# Patient Record
Sex: Female | Born: 1965
Health system: Southern US, Community
[De-identification: ages and names within clinical notes are randomized; demographics above are authoritative.]

## PROBLEM LIST (undated history)

## (undated) DIAGNOSIS — K279 Peptic ulcer, site unspecified, unspecified as acute or chronic, without hemorrhage or perforation: Secondary | ICD-10-CM

## (undated) DIAGNOSIS — K219 Gastro-esophageal reflux disease without esophagitis: Secondary | ICD-10-CM

## (undated) DIAGNOSIS — N924 Excessive bleeding in the premenopausal period: Secondary | ICD-10-CM

## (undated) DIAGNOSIS — A599 Trichomoniasis, unspecified: Secondary | ICD-10-CM

## (undated) DIAGNOSIS — E785 Hyperlipidemia, unspecified: Secondary | ICD-10-CM

## (undated) DIAGNOSIS — I10 Essential (primary) hypertension: Secondary | ICD-10-CM

## (undated) DIAGNOSIS — J45909 Unspecified asthma, uncomplicated: Secondary | ICD-10-CM

## (undated) DIAGNOSIS — M1990 Unspecified osteoarthritis, unspecified site: Secondary | ICD-10-CM

## (undated) DIAGNOSIS — E119 Type 2 diabetes mellitus without complications: Secondary | ICD-10-CM

## (undated) HISTORY — DX: Unspecified osteoarthritis, unspecified site: M19.90

## (undated) HISTORY — DX: Unspecified asthma, uncomplicated: J45.909

## (undated) HISTORY — DX: Hyperlipidemia, unspecified: E78.5

## (undated) HISTORY — DX: Essential (primary) hypertension: I10

## (undated) HISTORY — DX: Type 2 diabetes mellitus without complications: E11.9

## (undated) HISTORY — PX: BACK SURGERY: SHX140

## (undated) HISTORY — DX: Peptic ulcer, site unspecified, unspecified as acute or chronic, without hemorrhage or perforation: K27.9

## (undated) HISTORY — DX: Gastro-esophageal reflux disease without esophagitis: K21.9

## (undated) HISTORY — PX: CHOLECYSTECTOMY: SHX55

## (undated) HISTORY — DX: Trichomoniasis, unspecified: A59.9

---

## 2005-08-13 ENCOUNTER — Encounter: Admission: RE | Admit: 2005-08-13 | Discharge: 2005-08-23 | Payer: Self-pay | Admitting: Neurosurgery

## 2005-10-22 ENCOUNTER — Encounter: Admission: RE | Admit: 2005-10-22 | Discharge: 2005-10-22 | Payer: Self-pay | Admitting: Neurosurgery

## 2005-11-05 ENCOUNTER — Encounter: Admission: RE | Admit: 2005-11-05 | Discharge: 2005-11-05 | Payer: Self-pay | Admitting: Neurosurgery

## 2009-01-12 ENCOUNTER — Ambulatory Visit (HOSPITAL_COMMUNITY): Admission: RE | Admit: 2009-01-12 | Discharge: 2009-01-13 | Payer: Self-pay | Admitting: Neurosurgery

## 2010-11-02 ENCOUNTER — Emergency Department (HOSPITAL_COMMUNITY)
Admission: EM | Admit: 2010-11-02 | Discharge: 2010-11-02 | Disposition: A | Discharge status: Home / Self Care | Payer: Self-pay | Attending: Emergency Medicine | Admitting: Emergency Medicine

## 2010-11-02 DIAGNOSIS — E119 Type 2 diabetes mellitus without complications: Secondary | ICD-10-CM | POA: Insufficient documentation

## 2010-11-02 DIAGNOSIS — N390 Urinary tract infection, site not specified: Secondary | ICD-10-CM | POA: Insufficient documentation

## 2010-11-02 LAB — DIFFERENTIAL
Basophils Absolute: 0.1 10*3/uL (ref 0.0–0.1)
Basophils Relative: 0 % (ref 0–1)
Monocytes Absolute: 0.8 10*3/uL (ref 0.1–1.0)
Monocytes Relative: 6 % (ref 3–12)
Neutrophils Relative %: 62 % (ref 43–77)

## 2010-11-02 LAB — URINE MICROSCOPIC-ADD ON

## 2010-11-02 LAB — URINALYSIS, ROUTINE W REFLEX MICROSCOPIC
Hgb urine dipstick: NEGATIVE
Ketones, ur: NEGATIVE mg/dL
Leukocytes, UA: NEGATIVE
Nitrite: POSITIVE — AB
Protein, ur: NEGATIVE mg/dL
Urine Glucose, Fasting: >1000 mg/dL — AB
Urobilinogen, UA: 0.2 mg/dL (ref 0.0–1.0)

## 2010-11-02 LAB — CBC
HCT: 39.8 % (ref 36.0–46.0)
MCH: 28.9 pg (ref 26.0–34.0)
MCHC: 33.7 g/dL (ref 30.0–36.0)
MCV: 86 fL (ref 78.0–100.0)
WBC: 13.8 10*3/uL — ABNORMAL HIGH (ref 4.0–10.5)

## 2010-11-02 LAB — BASIC METABOLIC PANEL
CO2: 27 mEq/L (ref 19–32)
Chloride: 93 mEq/L — ABNORMAL LOW (ref 96–112)

## 2010-11-04 LAB — URINE CULTURE
Colony Count: >=100000
Culture  Setup Time: 201203020131

## 2010-11-09 ENCOUNTER — Emergency Department (HOSPITAL_COMMUNITY)
Admission: EM | Admit: 2010-11-09 | Discharge: 2010-11-10 | Disposition: A | Discharge status: Home / Self Care | Payer: Self-pay | Attending: Emergency Medicine | Admitting: Emergency Medicine

## 2010-11-09 DIAGNOSIS — E119 Type 2 diabetes mellitus without complications: Secondary | ICD-10-CM | POA: Insufficient documentation

## 2010-11-09 DIAGNOSIS — M1990 Unspecified osteoarthritis, unspecified site: Secondary | ICD-10-CM | POA: Insufficient documentation

## 2010-11-09 DIAGNOSIS — I10 Essential (primary) hypertension: Secondary | ICD-10-CM | POA: Insufficient documentation

## 2010-11-09 LAB — BASIC METABOLIC PANEL
GFR calc Af Amer: >60 mL/min (ref >60)
GFR calc non Af Amer: >60 mL/min (ref >60)
Potassium: 3.7 mEq/L (ref 3.5–5.1)

## 2010-11-09 LAB — URINE MICROSCOPIC-ADD ON

## 2010-11-09 LAB — URINALYSIS, ROUTINE W REFLEX MICROSCOPIC
Hgb urine dipstick: NEGATIVE
Ketones, ur: >80 mg/dL — AB
Nitrite: NEGATIVE
Urobilinogen, UA: 0.2 mg/dL (ref 0.0–1.0)

## 2010-11-10 LAB — TROPONIN I: Troponin I: 0.01 ng/mL (ref 0.00–0.06)

## 2010-12-12 LAB — CBC
HCT: 39.6 % (ref 36.0–46.0)
MCHC: 33.5 g/dL (ref 30.0–36.0)
Platelets: 336 10*3/uL (ref 150–400)
RBC: 4.32 MIL/uL (ref 3.87–5.11)
WBC: 12.5 10*3/uL — ABNORMAL HIGH (ref 4.0–10.5)

## 2010-12-12 LAB — BASIC METABOLIC PANEL
Calcium: 9.4 mg/dL (ref 8.4–10.5)
Creatinine, Ser: 0.78 mg/dL (ref 0.4–1.2)

## 2011-01-16 NOTE — H&P (Signed)
NAMEMARRI, MCNEFF                ACCOUNT NO.:  [id number]   MEDICAL RECORD NO.:  [id number]          PATIENT TYPE:  OIB   LOCATION:  3533                         FACILITY:  [facility]   PHYSICIAN:  Hilda Lias, M.D.   DATE OF BIRTH:  03-26-1966   DATE OF ADMISSION:  01/12/2009  DATE OF DISCHARGE:                              HISTORY & PHYSICAL   [name] is a lady who had been followed by me in my office for many  years complaining of back pain with radiation to the left leg.  The  patient had conservative treatment.  The pain is getting worse.  Initial, when I saw her in 2006, she had a herniated disk at the level  of L3-4 to the left side.  Nevertheless, through the year, the pain is  getting worse.  She __________ surgery.  She had been out of work.  Injection has not helped.  Repeat x-rays showed that she has a large  extraforaminal disk at the level of L3-4 and finally she decided with  surgery.   PAST MEDICAL HISTORY:  She had some type of surgery in her mouth.   She is not allergic to any medications.   SOCIAL HISTORY:  Negative.   FAMILY HISTORY:  Negative.   REVIEW OF SYSTEMS:  Positive for back pain and leg pain.   PHYSICAL EXAMINATION:  GENERAL:  The patient came to my office limping  from the left leg.  EARS, NOSE, AND THROAT:  Normal.  NECK:  Normal.  LUNGS:  Clear.  HEART:  Sounds normal.  EXTREMITIES:  Normal pulses.  NEURO:  She has weakness of the left quadriceps.  Femoral stretch  maneuver is positive in the left side and negative in the right side.  She has absent left knee jerk.   The MRI showed that she has had an extraforaminal herniated disk, large,  affecting the L3 nerve root.   IMPRESSION:  Left L3-L4 extraforaminal herniated disk.   RECOMMENDATIONS:  The patient is being admitted for surgery and the  procedure will be L3-L4 extraforaminal diskectomy.  The patient knows  about the risks such as infection, CSF leak, no improvement whatsoever  because of the chronicity pain.           ______________________________  Hilda Lias, M.D.    EB/MEDQ  D:  01/12/2009  T:  01/13/2009  Job:  [id number]

## 2011-01-16 NOTE — Op Note (Signed)
Laura Mullins, Laura Mullins                ACCOUNT NO.:  [id number]   MEDICAL RECORD NO.:  [id number]          PATIENT TYPE:  OIB   LOCATION:  3533                         FACILITY:  [facility]   PHYSICIAN:  Hilda Lias, M.D.   DATE OF BIRTH:  04-Sep-1965   DATE OF PROCEDURE:  01/12/2009  DATE OF DISCHARGE:                               OPERATIVE REPORT   PREOPERATIVE DIAGNOSES:  Left L3-L4 extraforaminal herniated disk with  chronic L3 radiculopathy.   POSTOPERATIVE DIAGNOSES:  Left L3-L4 extraforaminal herniated disk with  chronic L3 radiculopathy.   PROCEDURE:  Left L3-L4 extraforaminal diskectomy, decompression of the  L3 nerve root.  Removal of calcified herniated disk as well as soft  component.  Microscope.   SURGEON:  Hilda Lias, MD   ASSISTANT:  Cristi Loron, MD   CLINICAL HISTORY:  The patient has been followed by me for almost 4  years complaining of back and left leg pain.  Clinically, she is getting  worse.  She had failed conservative treatment.  MRI showed herniated  disk at L3-4.  The patient finally agreed with surgery.   PROCEDURE:  The patient was taken to the OR and after intubation, she  was positioned in prone manner.  The skin was cleaned with DuraPrep.  X-  ray was obtained and a midline incision from L3-4 was made and  retraction was extending all the way laterally until we were able to  feel the transverse process of L3-4.  Nevertheless, we did another x-  ray, then one probe was at the level of L3-4 and the other one was above  the transverse process of L3.  Then, we brought the microscope into the  area.  With the drill, we drilled the lateral facet of L3-4.  We removed  the intertransverse ligament.  We found the L3 nerve root which was  attached to the floor.  It was swollen and displaced posterior and  medially.  The patient had a large herniated disk with a hard and soft  component.  Lysis was accomplished.  We retracted the nerve root and  removal of soft herniated disk was accomplished.  We entered the disk  space and more soft tissue was retrieved.  At the end, we had good  decompression.  Valsalva maneuver was negative.  Then, Depo-Medrol and  fentanyl were left in the epidural space and the wound was closed with  Vicryl and Steri-Strips.           ______________________________  Hilda Lias, M.D.     EB/MEDQ  D:  01/12/2009  T:  01/13/2009  Job:  [id number]

## 2013-12-31 ENCOUNTER — Other Ambulatory Visit (HOSPITAL_COMMUNITY): Payer: Self-pay | Admitting: Physician Assistant

## 2013-12-31 DIAGNOSIS — Z1231 Encounter for screening mammogram for malignant neoplasm of breast: Secondary | ICD-10-CM

## 2014-01-07 ENCOUNTER — Ambulatory Visit (HOSPITAL_COMMUNITY)
Admission: RE | Admit: 2014-01-07 | Discharge: 2014-01-07 | Disposition: A | Discharge status: Home / Self Care | Payer: Medicare Other | Source: Ambulatory Visit | Attending: Physician Assistant | Admitting: Physician Assistant

## 2014-01-07 DIAGNOSIS — Z1231 Encounter for screening mammogram for malignant neoplasm of breast: Secondary | ICD-10-CM

## 2014-04-06 ENCOUNTER — Other Ambulatory Visit (HOSPITAL_COMMUNITY): Payer: Self-pay | Admitting: Physician Assistant

## 2014-04-06 ENCOUNTER — Encounter: Payer: Self-pay | Admitting: Obstetrics & Gynecology

## 2014-04-06 DIAGNOSIS — N939 Abnormal uterine and vaginal bleeding, unspecified: Secondary | ICD-10-CM

## 2014-04-09 ENCOUNTER — Ambulatory Visit (HOSPITAL_COMMUNITY)
Admission: RE | Admit: 2014-04-09 | Discharge: 2014-04-09 | Disposition: A | Discharge status: Home / Self Care | Payer: Medicare Other | Source: Ambulatory Visit | Attending: Physician Assistant | Admitting: Physician Assistant

## 2014-04-09 ENCOUNTER — Ambulatory Visit (HOSPITAL_COMMUNITY): Payer: Medicare Other

## 2014-04-09 DIAGNOSIS — N83209 Unspecified ovarian cyst, unspecified side: Secondary | ICD-10-CM | POA: Insufficient documentation

## 2014-04-09 DIAGNOSIS — N939 Abnormal uterine and vaginal bleeding, unspecified: Secondary | ICD-10-CM

## 2014-04-09 DIAGNOSIS — N898 Other specified noninflammatory disorders of vagina: Secondary | ICD-10-CM | POA: Diagnosis present

## 2014-04-20 ENCOUNTER — Encounter: Payer: Self-pay | Admitting: *Deleted

## 2014-04-20 DIAGNOSIS — N83201 Unspecified ovarian cyst, right side: Secondary | ICD-10-CM | POA: Insufficient documentation

## 2014-04-20 DIAGNOSIS — N924 Excessive bleeding in the premenopausal period: Secondary | ICD-10-CM | POA: Insufficient documentation

## 2014-04-20 DIAGNOSIS — R9389 Abnormal findings on diagnostic imaging of other specified body structures: Secondary | ICD-10-CM | POA: Insufficient documentation

## 2014-04-23 ENCOUNTER — Encounter: Payer: Self-pay | Admitting: Obstetrics and Gynecology

## 2014-04-23 ENCOUNTER — Ambulatory Visit (INDEPENDENT_AMBULATORY_CARE_PROVIDER_SITE_OTHER): Payer: Medicare Other | Admitting: Obstetrics and Gynecology

## 2014-04-23 VITALS — BP 120/80 | Ht 67.0 in | Wt 193.0 lb

## 2014-04-23 DIAGNOSIS — N898 Other specified noninflammatory disorders of vagina: Secondary | ICD-10-CM

## 2014-04-23 DIAGNOSIS — N939 Abnormal uterine and vaginal bleeding, unspecified: Secondary | ICD-10-CM

## 2014-04-23 MED ORDER — MEDROXYPROGESTERONE ACETATE 10 MG PO TABS: 10.0000 mg | ORAL_TABLET | Freq: Every day | ORAL | Status: DC

## 2014-04-23 NOTE — Progress Notes (Signed)
[facility] Visit  Patient name: Laura RivalStella K Alto MRN [id number]  Date of birth: [birth year]  CC & HPI:  Laura Mullins is a 48 y.o. female presenting today for irregular periods for the past six months. She states that her periods have gotten progressively worse. She states that she had no period in April, a regular period in May, 2 periods in June and has been bleeding since then. She states that her periods have been heavy for awhile now. [facility]. She states that her sugar has been good lately.   ROS:  +Irregular periods No other complaints.  Pertinent History Reviewed:   Reviewed: Significant for  Medical         Past Medical History  Diagnosis Date  . Asthma   . Diabetes mellitus without complication   . Hypertension   . Hyperlipidemia   . Arthritis                               Surgical Hx:    Past Surgical History  Procedure Laterality Date  . Back surgery     Medications: Reviewed & Updated - see associated section                      Current outpatient prescriptions:albuterol (PROVENTIL HFA;VENTOLIN HFA) 108 (90 BASE) MCG/ACT inhaler, Inhale 2 puffs into the lungs every 6 (six) hours as needed for wheezing or shortness of breath., Disp: , Rfl: ;  albuterol (PROVENTIL) (2.5 MG/3ML) 0.083% nebulizer solution, Take 2.5 mg by nebulization every 6 (six) hours as needed for wheezing or shortness of breath., Disp: , Rfl:  aspirin EC 81 MG tablet, Take 81 mg by mouth daily., Disp: , Rfl: ;  gabapentin (NEURONTIN) 600 MG tablet, Take 600 mg by mouth as needed., Disp: , Rfl: ;  glipiZIDE (GLUCOTROL) 10 MG tablet, Take 10 mg by mouth 2 (two) times daily before a meal., Disp: , Rfl: ;  lisinopril-hydrochlorothiazide (PRINZIDE,ZESTORETIC) 20-25 MG per tablet, Take 1 tablet by mouth daily., Disp: , Rfl:  loratadine (CLARITIN) 10 MG tablet, Take 10 mg by mouth daily as needed for allergies., Disp: , Rfl: ;  pantoprazole (PROTONIX) 40 MG tablet,  Take 40 mg by mouth daily., Disp: , Rfl: ;  rosuvastatin (CRESTOR) 20 MG tablet, Take 20 mg by mouth daily., Disp: , Rfl: ;  sitaGLIPtin-metformin (JANUMET) 50-1000 MG per tablet, Take 1 tablet by mouth 2 (two) times daily with a meal., Disp: , Rfl:    Social History: Reviewed -  reports that she has never smoked. She has never used smokeless tobacco.  Objective Findings:  Vitals: Blood pressure 120/80, height 5\' 7"  (1.702 m), weight 193 lb (87.544 kg), last menstrual period 02/28/2014.  Physical Examination: General appearance - alert, well appearing, and in no distress and oriented to person, place, and time Pelvic - normal external genitalia, vulva, vagina, cervix, uterus and adnexa, VULVA: normal appearing vulva with no masses, tenderness or lesions,  VAGINA: normal appearing vagina with normal color and discharge, no lesions, menstural blood present.  CERVIX: normal appearing cervix without discharge or lesions,  UTERUS: non-tender, 8 week size,  ADNEXA: normal adnexa in size, nontender and no masses,  RECTAL: rectal exam not indicated   Assessment & Plan:   A:  1. AUB  P:  1. Provera 10 mg daily x 14 days for 3 months.  2. Provera to take every month to bring on a period.   This chart was scribed by Chestine Spore, Medical Scribe, for Dr. Christin Bach on 04/23/14 at 1:08 PM. This chart was reviewed by Dr. Christin Bach for accuracy.

## 2014-06-04 ENCOUNTER — Telehealth: Payer: Self-pay | Admitting: Family Medicine

## 2014-06-04 ENCOUNTER — Encounter: Payer: Self-pay | Admitting: Family Medicine

## 2014-06-04 ENCOUNTER — Ambulatory Visit (INDEPENDENT_AMBULATORY_CARE_PROVIDER_SITE_OTHER): Payer: Medicare Other | Admitting: Family Medicine

## 2014-06-04 VITALS — BP 133/86 | HR 80 | Temp 97.9°F | Ht 67.0 in | Wt 197.0 lb

## 2014-06-04 DIAGNOSIS — R39198 Other difficulties with micturition: Secondary | ICD-10-CM

## 2014-06-04 DIAGNOSIS — I10 Essential (primary) hypertension: Secondary | ICD-10-CM

## 2014-06-04 DIAGNOSIS — N390 Urinary tract infection, site not specified: Secondary | ICD-10-CM

## 2014-06-04 DIAGNOSIS — R3989 Other symptoms and signs involving the genitourinary system: Secondary | ICD-10-CM

## 2014-06-04 LAB — POCT UA - MICROSCOPIC ONLY
Casts, Ur, LPF, POC: NEGATIVE
Crystals, Ur, HPF, POC: NEGATIVE
Mucus, UA: NEGATIVE

## 2014-06-04 LAB — POCT URINALYSIS DIPSTICK
Glucose, UA: 250
Ketones, UA: NEGATIVE
Nitrite, UA: NEGATIVE
Protein, UA: NEGATIVE
Spec Grav, UA: <=1.005
Urobilinogen, UA: NEGATIVE
pH, UA: 5

## 2014-06-04 MED ORDER — CIPROFLOXACIN HCL 500 MG PO TABS: 500.0000 mg | ORAL_TABLET | Freq: Two times a day (BID) | ORAL | Status: DC

## 2014-06-04 MED ORDER — LISINOPRIL-HYDROCHLOROTHIAZIDE 20-12.5 MG PO TABS: 1.0000 | ORAL_TABLET | Freq: Every day | ORAL | Status: AC

## 2014-06-04 NOTE — Telephone Encounter (Signed)
appt given for 3 today for UTI and [facility]

## 2014-06-04 NOTE — Progress Notes (Signed)
   Subjective:    Patient ID: Laura Mullins, female    DOB: 10/24/1965, 48 y.o.   MRN: [id number]  HPI This 48 y.o. female presents for evaluation of urinary frequency.   Review of Systems No chest pain, SOB, HA, dizziness, vision change, N/V, diarrhea, constipation, dysuria, urinary urgency or frequency, myalgias, arthralgias or rash.     Objective:   Physical Exam  Vital signs noted  Well developed well nourished female.  HEENT - Head atraumatic Normocephalic Respiratory - Lungs CTA bilateral Cardiac - RRR S1 and S2 without murmur GI - Abdomen soft Nontender and bowel sounds active x 4 Extremities - No edema. Neuro - Grossly intact.      Assessment & Plan:  Difficulty in urination - Plan: POCT UA - Microscopic Only, POCT urinalysis dipstick  Urinary tract infection without hematuria, site unspecified - Plan: Urine culture, ciprofloxacin (CIPRO) 500 MG tablet  Essential hypertension, benign - Plan: lisinopril-hydrochlorothiazide (ZESTORETIC) 20-12.5 MG per tablet  Deatra CanterWilliam J Oxford FNP

## 2014-06-05 LAB — URINE CULTURE

## 2014-08-23 ENCOUNTER — Ambulatory Visit: Payer: Medicare Other | Admitting: Obstetrics and Gynecology

## 2014-08-23 ENCOUNTER — Encounter: Payer: Self-pay | Admitting: Obstetrics and Gynecology

## 2014-09-05 ENCOUNTER — Emergency Department (HOSPITAL_COMMUNITY)
Admission: EM | Admit: 2014-09-05 | Discharge: 2014-09-05 | Disposition: A | Discharge status: Home / Self Care | Payer: Medicare Other | Attending: Emergency Medicine | Admitting: Emergency Medicine

## 2014-09-05 ENCOUNTER — Encounter (HOSPITAL_COMMUNITY): Payer: Self-pay | Admitting: Emergency Medicine

## 2014-09-05 DIAGNOSIS — E785 Hyperlipidemia, unspecified: Secondary | ICD-10-CM | POA: Diagnosis not present

## 2014-09-05 DIAGNOSIS — M545 Low back pain: Secondary | ICD-10-CM | POA: Insufficient documentation

## 2014-09-05 DIAGNOSIS — Z7982 Long term (current) use of aspirin: Secondary | ICD-10-CM | POA: Diagnosis not present

## 2014-09-05 DIAGNOSIS — I10 Essential (primary) hypertension: Secondary | ICD-10-CM | POA: Diagnosis not present

## 2014-09-05 DIAGNOSIS — M79605 Pain in left leg: Secondary | ICD-10-CM | POA: Insufficient documentation

## 2014-09-05 DIAGNOSIS — M1990 Unspecified osteoarthritis, unspecified site: Secondary | ICD-10-CM | POA: Insufficient documentation

## 2014-09-05 DIAGNOSIS — A5901 Trichomonal vulvovaginitis: Secondary | ICD-10-CM | POA: Diagnosis not present

## 2014-09-05 DIAGNOSIS — M79604 Pain in right leg: Secondary | ICD-10-CM | POA: Diagnosis not present

## 2014-09-05 DIAGNOSIS — R739 Hyperglycemia, unspecified: Secondary | ICD-10-CM

## 2014-09-05 DIAGNOSIS — A599 Trichomoniasis, unspecified: Secondary | ICD-10-CM

## 2014-09-05 DIAGNOSIS — Z79899 Other long term (current) drug therapy: Secondary | ICD-10-CM | POA: Insufficient documentation

## 2014-09-05 DIAGNOSIS — K5900 Constipation, unspecified: Secondary | ICD-10-CM | POA: Insufficient documentation

## 2014-09-05 DIAGNOSIS — J45909 Unspecified asthma, uncomplicated: Secondary | ICD-10-CM | POA: Insufficient documentation

## 2014-09-05 DIAGNOSIS — E1165 Type 2 diabetes mellitus with hyperglycemia: Secondary | ICD-10-CM | POA: Diagnosis not present

## 2014-09-05 DIAGNOSIS — M79606 Pain in leg, unspecified: Secondary | ICD-10-CM

## 2014-09-05 HISTORY — DX: Excessive bleeding in the premenopausal period: N92.4

## 2014-09-05 LAB — CBC WITH DIFFERENTIAL/PLATELET
BASOS PCT: 0 % (ref 0–1)
Basophils Absolute: 0 10*3/uL (ref 0.0–0.1)
Eosinophils Absolute: 0.1 10*3/uL (ref 0.0–0.7)
Eosinophils Relative: 1 % (ref 0–5)
HCT: 36.6 % (ref 36.0–46.0)
HEMOGLOBIN: 12.1 g/dL (ref 12.0–15.0)
Lymphocytes Relative: 30 % (ref 12–46)
Lymphs Abs: 3 10*3/uL (ref 0.7–4.0)
MCH: 28.7 pg (ref 26.0–34.0)
MCHC: 33.1 g/dL (ref 30.0–36.0)
MCV: 86.7 fL (ref 78.0–100.0)
MONOS PCT: 7 % (ref 3–12)
Monocytes Absolute: 0.7 10*3/uL (ref 0.1–1.0)
NEUTROS ABS: 6.1 10*3/uL (ref 1.7–7.7)
Neutrophils Relative %: 62 % (ref 43–77)
Platelets: 305 10*3/uL (ref 150–400)
RBC: 4.22 MIL/uL (ref 3.87–5.11)
RDW: 12.7 % (ref 11.5–15.5)
WBC: 9.9 10*3/uL (ref 4.0–10.5)

## 2014-09-05 LAB — URINE MICROSCOPIC-ADD ON

## 2014-09-05 LAB — URINALYSIS, ROUTINE W REFLEX MICROSCOPIC
Bilirubin Urine: NEGATIVE
Glucose, UA: >1000 mg/dL — AB
Ketones, ur: NEGATIVE mg/dL
LEUKOCYTES UA: NEGATIVE
Nitrite: NEGATIVE
UROBILINOGEN UA: 0.2 mg/dL (ref 0.0–1.0)
pH: 6.5 (ref 5.0–8.0)

## 2014-09-05 LAB — BASIC METABOLIC PANEL
Anion gap: 6 (ref 5–15)
BUN: 9 mg/dL (ref 6–23)
CHLORIDE: 101 meq/L (ref 96–112)
CO2: 25 mmol/L (ref 19–32)
Calcium: 9 mg/dL (ref 8.4–10.5)
Creatinine, Ser: 0.93 mg/dL (ref 0.50–1.10)
GFR calc Af Amer: 83 mL/min — ABNORMAL LOW (ref >90)
GFR calc non Af Amer: 72 mL/min — ABNORMAL LOW (ref >90)
Glucose, Bld: 461 mg/dL — ABNORMAL HIGH (ref 70–99)
Potassium: 4.1 mmol/L (ref 3.5–5.1)
Sodium: 132 mmol/L — ABNORMAL LOW (ref 135–145)

## 2014-09-05 LAB — CBG MONITORING, ED
Glucose-Capillary: 460 mg/dL — ABNORMAL HIGH (ref 70–99)
Glucose-Capillary: 244 mg/dL — ABNORMAL HIGH (ref 70–99)

## 2014-09-05 MED ORDER — SODIUM CHLORIDE 0.9 % IV BOLUS (SEPSIS)
2000.0000 mL | Freq: Once | INTRAVENOUS | Status: AC
  Administered 2014-09-05: 2000 mL via INTRAVENOUS

## 2014-09-05 MED ORDER — SODIUM CHLORIDE 0.9 % IV BOLUS (SEPSIS)
1000.0000 mL | Freq: Once | INTRAVENOUS | Status: AC
  Administered 2014-09-05: 1000 mL via INTRAVENOUS

## 2014-09-05 MED ORDER — METRONIDAZOLE 500 MG PO TABS: 500.0000 mg | ORAL_TABLET | Freq: Two times a day (BID) | ORAL | Status: DC

## 2014-09-05 MED ORDER — MORPHINE SULFATE 4 MG/ML IJ SOLN
4.0000 mg | Freq: Once | INTRAMUSCULAR | Status: AC
  Administered 2014-09-05: 4 mg via INTRAVENOUS
  Filled 2014-09-05: qty 1

## 2014-09-05 MED ORDER — ONDANSETRON HCL 4 MG/2ML IJ SOLN
4.0000 mg | Freq: Once | INTRAMUSCULAR | Status: AC
  Administered 2014-09-05: 4 mg via INTRAVENOUS
  Filled 2014-09-05: qty 2

## 2014-09-05 NOTE — ED Notes (Signed)
Pt reports bilateral lower leg pain x2 weeks, urinary frequency, dysuria starting today, polydipsia. nad noted.

## 2014-09-05 NOTE — ED Provider Notes (Signed)
CSN: [id number]     Arrival date & time 09/05/14  1524 History  This chart was scribed for Flint Melter, MD by Tonye Royalty, ED Scribe. This patient was seen in room APA03/APA03 and the patient's care was started at 3:44 PM.   Chief Complaint  Patient presents with  . Leg Pain   The history is provided by the patient. No language interpreter was used.    HPI Comments: Laura Mullins is a 49 y.o. female with history of diabetes, HTN, and HLD who presents to the Emergency Department complaining of bilateral leg cramping with onset 2 weeks ago, worse last night for 3-4 hours. She also reports burning and numbness to her legs. She states it was painful to move them. She states she has never had similar symptoms before. She reports prior back problems and surgery in 2010 with occasional flare ups. She reports associated urinary frequency with onset 2 weeks ago and dysuria with more recent onset. She notes she has only had 2 bowel movements in the past 1.5 weeks. She notes vaginal bleeding since 1 month ago for which she has been evaluated. She reports Depo shot on 11/18 by Gwenlyn Perking. She notes she is in menopause. She states she has an appointment with her PCP on 1/25. She states she is compliant with all her medications. She denies history of cardiac disease. She denies diarrhea.   Past Medical History  Diagnosis Date  . Asthma   . Diabetes mellitus without complication   . Hypertension   . Hyperlipidemia   . Arthritis   . Premenopause menorrhagia    Past Surgical History  Procedure Laterality Date  . Back surgery     Family History  Problem Relation Age of Onset  . Hypertension Mother   . Hyperlipidemia Mother   . Diabetes Mother   . Stroke Father   . Heart disease Father   . Heart attack Father   . Cancer Sister     leukemia  . Diabetes Sister   . Cancer Brother   . Diabetes Brother   . Diabetes Maternal Grandmother   . Cancer Maternal Grandmother   . Alzheimer's disease  Maternal Grandmother   . Cancer Sister   . Diabetes Sister   . Cancer Sister   . Diabetes Sister   . Diabetes Sister   . Diabetes Sister   . Diabetes Sister   . Diabetes Sister   . Cancer Brother   . Diabetes Brother   . Diabetes Brother   . Diabetes Brother   . Diabetes Brother   . Heart disease Brother   . Diabetes Brother   . Cancer Brother     colon  . Diabetes Brother   . Hyperlipidemia Brother   . Diabetes Brother   . Diabetes Brother    History  Substance Use Topics  . Smoking status: Never Smoker   . Smokeless tobacco: Never Used  . Alcohol Use: No   OB History    No data available     Review of Systems  Gastrointestinal: Positive for constipation. Negative for diarrhea.  Endocrine: Positive for polydipsia.  Genitourinary: Positive for dysuria, frequency and vaginal bleeding.  Musculoskeletal:       Leg cramping and burning  Neurological: Positive for numbness.  All other systems reviewed and are negative.     Allergies  Review of patient's allergies indicates no known allergies.  Home Medications   Prior to Admission medications   Medication Sig Start Date  End Date Taking? Authorizing Provider  albuterol (PROVENTIL HFA;VENTOLIN HFA) 108 (90 BASE) MCG/ACT inhaler Inhale 2 puffs into the lungs every 6 (six) hours as needed for wheezing or shortness of breath.   Yes Historical Provider, MD  albuterol (PROVENTIL) (2.5 MG/3ML) 0.083% nebulizer solution Take 2.5 mg by nebulization every 6 (six) hours as needed for wheezing or shortness of breath.   Yes Historical Provider, MD  aspirin EC 81 MG tablet Take 81 mg by mouth daily.   Yes Historical Provider, MD  Aspirin-Salicylamide-Caffeine (BC HEADACHE) 325-95-16 MG TABS Take 1 packet by mouth daily as needed (for pain).   Yes Historical Provider, MD  gabapentin (NEURONTIN) 600 MG tablet Take 600 mg by mouth daily as needed (for neuropathy/pain).    Yes Historical Provider, MD  glipiZIDE (GLUCOTROL) 10 MG  tablet Take 10 mg by mouth daily.    Yes Historical Provider, MD  lisinopril-hydrochlorothiazide (ZESTORETIC) 20-12.5 MG per tablet Take 1 tablet by mouth daily. 06/04/14  Yes Deatra Canter, FNP  medroxyPROGESTERone (DEPO-PROVERA) 150 MG/ML injection Inject 150 mg into the muscle every 3 (three) months.   Yes Historical Provider, MD  pantoprazole (PROTONIX) 40 MG tablet Take 40 mg by mouth daily.   Yes Historical Provider, MD  rosuvastatin (CRESTOR) 20 MG tablet Take 20 mg by mouth at bedtime.    Yes Historical Provider, MD  sitaGLIPtin-metformin (JANUMET) 50-1000 MG per tablet Take 1 tablet by mouth 2 (two) times daily with a meal.   Yes Historical Provider, MD  ciprofloxacin (CIPRO) 500 MG tablet Take 1 tablet (500 mg total) by mouth 2 (two) times daily. Patient not taking: Reported on 09/05/2014 06/04/14   Deatra Canter, FNP  loratadine (CLARITIN) 10 MG tablet Take 10 mg by mouth daily as needed for allergies.    Historical Provider, MD  medroxyPROGESTERone (PROVERA) 10 MG tablet Take 1 tablet (10 mg total) by mouth daily. Take x 14 days each month. Patient not taking: Reported on 09/05/2014 04/23/14   Tilda Burrow, MD  metroNIDAZOLE (FLAGYL) 500 MG tablet Take 1 tablet (500 mg total) by mouth 2 (two) times daily. One po bid x 7 days 09/05/14   Flint Melter, MD   BP 125/86 mmHg  Pulse 83  Temp(Src) 97.9 F (36.6 C) (Oral)  Resp 18  Ht  (1.702 m)  Wt 197 lb (89.359 kg)  BMI 30.85 kg/m2  SpO2 100%  LMP 09/05/2014 Physical Exam  Constitutional: She is oriented to person, place, and time. She appears well-developed and well-nourished.  HENT:  Head: Normocephalic and atraumatic.  Mucous membranes moist  Eyes: Conjunctivae and EOM are normal. Pupils are equal, round, and reactive to light.  Neck: Normal range of motion and phonation normal. Neck supple.  Cardiovascular: Normal rate and regular rhythm.   Pulmonary/Chest: Effort normal and breath sounds normal. She exhibits no  tenderness.  Abdominal: Soft. She exhibits no distension. There is no tenderness. There is no guarding.  Musculoskeletal: Normal range of motion.  Mild left lumbar tenderness Moving all extremities normally No tenderness to legs and distal sensation intact  Neurological: She is alert and oriented to person, place, and time. She exhibits normal muscle tone.  Skin: Skin is warm and dry.  Psychiatric: She has a normal mood and affect. Her behavior is normal. Judgment and thought content normal.  Nursing note and vitals reviewed.   ED Course  Procedures (including critical care time)  DIAGNOSTIC STUDIES: Oxygen Saturation is 100% on room air, normal by  my interpretation.    COORDINATION OF CARE: 3:54 PM Discussed treatment plan with patient at beside, the patient agrees with the plan and has no further questions at this time.  Medications  sodium chloride 0.9 % bolus 1,000 mL (0 mLs Intravenous Stopped 09/05/14 1708)  ondansetron (ZOFRAN) injection 4 mg (4 mg Intravenous Given 09/05/14 1622)  morphine 4 MG/ML injection 4 mg (4 mg Intravenous Given 09/05/14 1622)  sodium chloride 0.9 % bolus 2,000 mL (0 mLs Intravenous Stopped 09/05/14 1815)    Patient Vitals for the past 24 hrs:  BP Temp Temp src Pulse Resp SpO2 Height Weight  09/05/14 1529 125/86 mmHg 97.9 F (36.6 C) Oral 83 18 100 % 5\' 7"  (1.702 m) 197 lb (89.359 kg)    6:45 PM Reevaluation with update and discussion. After initial assessment and treatment, an updated evaluation reveals she is comfortable now tolerating oral fluids.  He is able to walk without problems.  She states she has just minimal pain in her right leg.  Findings discussed with patient, all questions answered.Mancel Bale L     Labs Review Labs Reviewed  BASIC METABOLIC PANEL - Abnormal; Notable for the following:    Sodium 132 (*)    Glucose, Bld 461 (*)    GFR calc non Af Amer 72 (*)    GFR calc Af Amer 83 (*)    All other components within normal limits   URINALYSIS, ROUTINE W REFLEX MICROSCOPIC - Abnormal; Notable for the following:    Color, Urine RED (*)    APPearance HAZY (*)    Specific Gravity, Urine <1.005 (*)    Glucose, UA >1000 (*)    Hgb urine dipstick LARGE (*)    Protein, ur TRACE (*)    All other components within normal limits  URINE MICROSCOPIC-ADD ON - Abnormal; Notable for the following:    Squamous Epithelial / LPF FEW (*)    Bacteria, UA FEW (*)    All other components within normal limits  CBG MONITORING, ED - Abnormal; Notable for the following:    Glucose-Capillary 460 (*)    All other components within normal limits  CBG MONITORING, ED - Abnormal; Notable for the following:    Glucose-Capillary 244 (*)    All other components within normal limits  URINE CULTURE  CBC WITH DIFFERENTIAL    Imaging Review No results found.   EKG Interpretation None      MDM   Final diagnoses:  Hyperglycemia  Trichomonas infection  Pain of lower extremity, unspecified laterality    Hyperglycemia, without apparent infection or metabolic instability.  I suspect that she has had chronic elevation of her blood sugar.  She is on relatively high-dose of dual therapy.  There is no easy change to accomplish glucose control from the emergency department setting.  Since she is stable, she can follow-up with her PCP to do that.  There is incidental finding of Trichomonas in her urine.  There is also blood, but it is likely cross contaminant from her vaginal bleeding.  Trichomonas has been present previously on testing in 2012.   Nursing Notes Reviewed/ Care Coordinated Applicable Imaging Reviewed Interpretation of Laboratory Data incorporated into ED treatment  The patient appears reasonably screened and/or stabilized for discharge and I doubt any other medical condition or other [facility] requiring further screening, evaluation, or treatment in the ED at this time prior to discharge.  Plan: Home Medications- usual plus Flagyl; Home  Treatments- increase oral intake of free water  by 1 quart teach day; return here if the recommended treatment, does not improve the symptoms; Recommended follow up- PCP 1 week.  Advised to have  husband checked for Trichomonas infection.   Flint Melter, MD 09/05/14 [phone]

## 2014-09-05 NOTE — ED Notes (Signed)
cbg in triage 460.

## 2014-09-05 NOTE — Discharge Instructions (Signed)
Try to drink an extra quart of water each day. This will help to lower your blood sugar. See your PCP as soon as possible to discuss modifying your medications to improve your glucose levels. The type of urinary tract infection that you have can be passed between sexual partners. Your husband should also be checked and possibly treated for it.      Hyperglycemia Hyperglycemia occurs when the glucose (sugar) in your blood is too high. Hyperglycemia can happen for many reasons, but it most often happens to people who do not know they have diabetes or are not managing their diabetes properly.  CAUSES  Whether you have diabetes or not, there are other causes of hyperglycemia. Hyperglycemia can occur when you have diabetes, but it can also occur in other situations that you might not be as aware of, such as: Diabetes  If you have diabetes and are having problems controlling your blood glucose, hyperglycemia could occur because of some of the following reasons:  Not following your meal plan.  Not taking your diabetes medications or not taking it properly.  Exercising less or doing less activity than you normally do.  Being sick. Pre-diabetes  This cannot be ignored. Before people develop Type 2 diabetes, they almost always have "pre-diabetes." This is when your blood glucose levels are higher than normal, but not yet high enough to be diagnosed as diabetes. Research has shown that some long-term damage to the body, especially the heart and circulatory system, may already be occurring during pre-diabetes. If you take action to manage your blood glucose when you have pre-diabetes, you may delay or prevent Type 2 diabetes from developing. Stress  If you have diabetes, you may be "diet" controlled or on oral medications or insulin to control your diabetes. However, you may find that your blood glucose is higher than usual in the hospital whether you have diabetes or not. This is often referred to  as "stress hyperglycemia." Stress can elevate your blood glucose. This happens because of hormones put out by the body during times of stress. If stress has been the cause of your high blood glucose, it can be followed regularly by your caregiver. That way he/she can make sure your hyperglycemia does not continue to get worse or progress to diabetes. Steroids  Steroids are medications that act on the infection fighting system (immune system) to block inflammation or infection. One side effect can be a rise in blood glucose. Most people can produce enough extra insulin to allow for this rise, but for those who cannot, steroids make blood glucose levels go even higher. It is not unusual for steroid treatments to "uncover" diabetes that is developing. It is not always possible to determine if the hyperglycemia will go away after the steroids are stopped. A special blood test called an A1c is sometimes done to determine if your blood glucose was elevated before the steroids were started. SYMPTOMS  Thirsty.  Frequent urination.  Dry mouth.  Blurred vision.  Tired or fatigue.  Weakness.  Sleepy.  Tingling in feet or leg. DIAGNOSIS  Diagnosis is made by monitoring blood glucose in one or all of the following ways:  A1c test. This is a chemical found in your blood.  Fingerstick blood glucose monitoring.  Laboratory results. TREATMENT  First, knowing the cause of the hyperglycemia is important before the hyperglycemia can be treated. Treatment may include, but is not be limited to:  Education.  Change or adjustment in medications.  Change or adjustment  in meal plan.  Treatment for an illness, infection, etc.  More frequent blood glucose monitoring.  Change in exercise plan.  Decreasing or stopping steroids.  Lifestyle changes. HOME CARE INSTRUCTIONS   Test your blood glucose as directed.  Exercise regularly. Your caregiver will give you instructions about exercise.  Pre-diabetes or diabetes which comes on with stress is helped by exercising.  Eat wholesome, balanced meals. Eat often and at regular, fixed times. Your caregiver or nutritionist will give you a meal plan to guide your sugar intake.  Being at an ideal weight is important. If needed, losing as little as 10 to 15 pounds may help improve blood glucose levels. SEEK MEDICAL CARE IF:   You have questions about medicine, activity, or diet.  You continue to have symptoms (problems such as increased thirst, urination, or weight gain). SEEK IMMEDIATE MEDICAL CARE IF:   You are vomiting or have diarrhea.  Your breath smells fruity.  You are breathing faster or slower.  You are very sleepy or incoherent.  You have numbness, tingling, or pain in your feet or hands.  You have chest pain.  Your symptoms get worse even though you have been following your caregiver's orders.  If you have any other questions or concerns. Document Released: 02/13/2001 Document Revised: 11/12/2011 Document Reviewed: 12/17/2011 [facility] Patient Information 2015 [place], [place]. This information is not intended to replace advice given to you by your health care provider. Make sure you discuss any questions you have with your health care provider.  Trichomoniasis Trichomoniasis is an infection caused by an organism called Trichomonas. The infection can affect both women and men. In women, the outer female genitalia and the vagina are affected. In men, the penis is mainly affected, but the prostate and other reproductive organs can also be involved. Trichomoniasis is a sexually transmitted infection (STI) and is most often passed to another person through sexual contact.  RISK FACTORS  Having unprotected sexual intercourse.  Having sexual intercourse with an infected partner. SIGNS AND SYMPTOMS  Symptoms of trichomoniasis in women include:  Abnormal gray-green frothy vaginal discharge.  Itching and irritation of  the vagina.  Itching and irritation of the area outside the vagina. Symptoms of trichomoniasis in men include:   Penile discharge with or without pain.  Pain during urination. This results from inflammation of the urethra. DIAGNOSIS  Trichomoniasis may be found during a Pap test or physical exam. Your health care provider may use one of the following methods to help diagnose this infection:  Examining vaginal discharge under a microscope. For men, urethral discharge would be examined.  Testing the pH of the vagina with a test tape.  Using a vaginal swab test that checks for the Trichomonas organism. A test is available that provides results within a few minutes.  Doing a culture test for the organism. This is not usually needed. TREATMENT   You may be given medicine to fight the infection. Women should inform their health care provider if they could be or are pregnant. Some medicines used to treat the infection should not be taken during pregnancy.  Your health care provider may recommend over-the-counter medicines or creams to decrease itching or irritation.  Your sexual partner will need to be treated if infected. HOME CARE INSTRUCTIONS   Take medicines only as directed by your health care provider.  Take over-the-counter medicine for itching or irritation as directed by your health care provider.  Do not have sexual intercourse while you have the infection.  Women should not douche or wear tampons while they have the infection.  Discuss your infection with your partner. Your partner may have gotten the infection from you, or you may have gotten it from your partner.  Have your sex partner get examined and treated if necessary.  Practice safe, informed, and protected sex.  See your health care provider for other STI testing. SEEK MEDICAL CARE IF:   You still have symptoms after you finish your medicine.  You develop abdominal pain.  You have pain when you  urinate.  You have bleeding after sexual intercourse.  You develop a rash.  Your medicine makes you sick or makes you throw up (vomit). MAKE SURE YOU:  Understand these instructions.  Will watch your condition.  Will get help right away if you are not doing well or get worse. Document Released: 02/13/2001 Document Revised: 01/04/2014 Document Reviewed: 06/01/2013 [facility] Patient Information 2015 [place], [place]. This information is not intended to replace advice given to you by your health care provider. Make sure you discuss any questions you have with your health care provider.

## 2014-09-05 NOTE — ED Notes (Signed)
Discharge instructions given, pt demonstrated teach back and verbal understanding. No concerns voiced.  

## 2014-09-07 LAB — URINE CULTURE: Colony Count: >=100000

## 2014-09-17 DIAGNOSIS — I10 Essential (primary) hypertension: Secondary | ICD-10-CM | POA: Diagnosis not present

## 2014-09-17 DIAGNOSIS — R739 Hyperglycemia, unspecified: Secondary | ICD-10-CM | POA: Diagnosis not present

## 2014-09-17 DIAGNOSIS — B9689 Other specified bacterial agents as the cause of diseases classified elsewhere: Secondary | ICD-10-CM | POA: Diagnosis not present

## 2014-09-17 DIAGNOSIS — E860 Dehydration: Secondary | ICD-10-CM | POA: Diagnosis not present

## 2014-09-17 DIAGNOSIS — R630 Anorexia: Secondary | ICD-10-CM | POA: Diagnosis not present

## 2014-09-17 DIAGNOSIS — E785 Hyperlipidemia, unspecified: Secondary | ICD-10-CM | POA: Diagnosis not present

## 2014-09-17 DIAGNOSIS — M791 Myalgia: Secondary | ICD-10-CM | POA: Diagnosis not present

## 2014-09-17 DIAGNOSIS — R5383 Other fatigue: Secondary | ICD-10-CM | POA: Diagnosis not present

## 2014-09-17 DIAGNOSIS — R350 Frequency of micturition: Secondary | ICD-10-CM | POA: Diagnosis not present

## 2014-09-17 DIAGNOSIS — N390 Urinary tract infection, site not specified: Secondary | ICD-10-CM | POA: Diagnosis not present

## 2014-09-17 DIAGNOSIS — E1165 Type 2 diabetes mellitus with hyperglycemia: Secondary | ICD-10-CM | POA: Diagnosis not present

## 2014-09-27 DIAGNOSIS — Z7982 Long term (current) use of aspirin: Secondary | ICD-10-CM | POA: Diagnosis not present

## 2014-09-27 DIAGNOSIS — Z794 Long term (current) use of insulin: Secondary | ICD-10-CM | POA: Diagnosis not present

## 2014-09-27 DIAGNOSIS — E1165 Type 2 diabetes mellitus with hyperglycemia: Secondary | ICD-10-CM | POA: Diagnosis not present

## 2014-09-27 DIAGNOSIS — R631 Polydipsia: Secondary | ICD-10-CM | POA: Diagnosis not present

## 2014-09-27 DIAGNOSIS — I10 Essential (primary) hypertension: Secondary | ICD-10-CM | POA: Diagnosis not present

## 2014-09-27 DIAGNOSIS — R358 Other polyuria: Secondary | ICD-10-CM | POA: Diagnosis not present

## 2014-09-27 DIAGNOSIS — Z23 Encounter for immunization: Secondary | ICD-10-CM | POA: Diagnosis not present

## 2014-09-27 DIAGNOSIS — E785 Hyperlipidemia, unspecified: Secondary | ICD-10-CM | POA: Diagnosis not present

## 2014-10-15 DIAGNOSIS — Z3042 Encounter for surveillance of injectable contraceptive: Secondary | ICD-10-CM | POA: Diagnosis not present

## 2014-11-05 DIAGNOSIS — J45909 Unspecified asthma, uncomplicated: Secondary | ICD-10-CM | POA: Diagnosis not present

## 2014-11-05 DIAGNOSIS — E1169 Type 2 diabetes mellitus with other specified complication: Secondary | ICD-10-CM | POA: Diagnosis not present

## 2014-11-05 DIAGNOSIS — E785 Hyperlipidemia, unspecified: Secondary | ICD-10-CM | POA: Diagnosis not present

## 2014-11-05 DIAGNOSIS — E1165 Type 2 diabetes mellitus with hyperglycemia: Secondary | ICD-10-CM | POA: Diagnosis not present

## 2014-11-05 DIAGNOSIS — Z794 Long term (current) use of insulin: Secondary | ICD-10-CM | POA: Diagnosis not present

## 2014-11-05 DIAGNOSIS — Z7982 Long term (current) use of aspirin: Secondary | ICD-10-CM | POA: Diagnosis not present

## 2014-11-05 DIAGNOSIS — I10 Essential (primary) hypertension: Secondary | ICD-10-CM | POA: Diagnosis not present

## 2015-01-11 DIAGNOSIS — N921 Excessive and frequent menstruation with irregular cycle: Secondary | ICD-10-CM | POA: Diagnosis not present

## 2015-01-17 DIAGNOSIS — E1165 Type 2 diabetes mellitus with hyperglycemia: Secondary | ICD-10-CM | POA: Diagnosis not present

## 2015-01-17 DIAGNOSIS — I10 Essential (primary) hypertension: Secondary | ICD-10-CM | POA: Diagnosis not present

## 2015-01-17 DIAGNOSIS — Z794 Long term (current) use of insulin: Secondary | ICD-10-CM | POA: Diagnosis not present

## 2015-01-17 DIAGNOSIS — Z7982 Long term (current) use of aspirin: Secondary | ICD-10-CM | POA: Diagnosis not present

## 2015-01-17 DIAGNOSIS — Z79899 Other long term (current) drug therapy: Secondary | ICD-10-CM | POA: Diagnosis not present

## 2015-01-17 DIAGNOSIS — E118 Type 2 diabetes mellitus with unspecified complications: Secondary | ICD-10-CM | POA: Diagnosis not present

## 2015-01-17 DIAGNOSIS — E785 Hyperlipidemia, unspecified: Secondary | ICD-10-CM | POA: Diagnosis not present

## 2015-02-21 DIAGNOSIS — Z79899 Other long term (current) drug therapy: Secondary | ICD-10-CM | POA: Diagnosis not present

## 2015-02-21 DIAGNOSIS — E785 Hyperlipidemia, unspecified: Secondary | ICD-10-CM | POA: Diagnosis not present

## 2015-02-21 DIAGNOSIS — Z794 Long term (current) use of insulin: Secondary | ICD-10-CM | POA: Diagnosis not present

## 2015-02-21 DIAGNOSIS — E1169 Type 2 diabetes mellitus with other specified complication: Secondary | ICD-10-CM | POA: Diagnosis not present

## 2015-02-21 DIAGNOSIS — E1165 Type 2 diabetes mellitus with hyperglycemia: Secondary | ICD-10-CM | POA: Diagnosis not present

## 2015-02-21 DIAGNOSIS — I10 Essential (primary) hypertension: Secondary | ICD-10-CM | POA: Diagnosis not present

## 2015-02-24 ENCOUNTER — Emergency Department (HOSPITAL_COMMUNITY)
Admission: EM | Admit: 2015-02-24 | Discharge: 2015-02-24 | Disposition: A | Discharge status: Home / Self Care | Payer: Medicare Other | Attending: Emergency Medicine | Admitting: Emergency Medicine

## 2015-02-24 ENCOUNTER — Emergency Department (HOSPITAL_COMMUNITY): Payer: Medicare Other

## 2015-02-24 ENCOUNTER — Encounter (HOSPITAL_COMMUNITY): Payer: Self-pay | Admitting: Emergency Medicine

## 2015-02-24 DIAGNOSIS — R52 Pain, unspecified: Secondary | ICD-10-CM | POA: Diagnosis present

## 2015-02-24 DIAGNOSIS — M791 Myalgia, unspecified site: Secondary | ICD-10-CM

## 2015-02-24 DIAGNOSIS — Z79899 Other long term (current) drug therapy: Secondary | ICD-10-CM | POA: Insufficient documentation

## 2015-02-24 DIAGNOSIS — R000 Tachycardia, unspecified: Secondary | ICD-10-CM | POA: Diagnosis not present

## 2015-02-24 DIAGNOSIS — R0981 Nasal congestion: Secondary | ICD-10-CM | POA: Insufficient documentation

## 2015-02-24 DIAGNOSIS — N390 Urinary tract infection, site not specified: Secondary | ICD-10-CM | POA: Diagnosis not present

## 2015-02-24 DIAGNOSIS — M1990 Unspecified osteoarthritis, unspecified site: Secondary | ICD-10-CM | POA: Insufficient documentation

## 2015-02-24 DIAGNOSIS — R05 Cough: Secondary | ICD-10-CM | POA: Diagnosis not present

## 2015-02-24 DIAGNOSIS — J45909 Unspecified asthma, uncomplicated: Secondary | ICD-10-CM | POA: Diagnosis not present

## 2015-02-24 DIAGNOSIS — E1165 Type 2 diabetes mellitus with hyperglycemia: Secondary | ICD-10-CM | POA: Diagnosis not present

## 2015-02-24 DIAGNOSIS — I10 Essential (primary) hypertension: Secondary | ICD-10-CM | POA: Diagnosis not present

## 2015-02-24 DIAGNOSIS — R059 Cough, unspecified: Secondary | ICD-10-CM

## 2015-02-24 DIAGNOSIS — R739 Hyperglycemia, unspecified: Secondary | ICD-10-CM

## 2015-02-24 DIAGNOSIS — Z7982 Long term (current) use of aspirin: Secondary | ICD-10-CM | POA: Insufficient documentation

## 2015-02-24 DIAGNOSIS — E119 Type 2 diabetes mellitus without complications: Secondary | ICD-10-CM | POA: Diagnosis not present

## 2015-02-24 DIAGNOSIS — E785 Hyperlipidemia, unspecified: Secondary | ICD-10-CM | POA: Diagnosis not present

## 2015-02-24 DIAGNOSIS — R509 Fever, unspecified: Secondary | ICD-10-CM | POA: Diagnosis not present

## 2015-02-24 LAB — COMPREHENSIVE METABOLIC PANEL
ALK PHOS: 121 U/L (ref 38–126)
ALT: 22 U/L (ref 14–54)
AST: 25 U/L (ref 15–41)
Albumin: 3.8 g/dL (ref 3.5–5.0)
Anion gap: 12 (ref 5–15)
BUN: 8 mg/dL (ref 6–20)
CHLORIDE: 95 mmol/L — AB (ref 101–111)
CO2: 23 mmol/L (ref 22–32)
Calcium: 9.6 mg/dL (ref 8.9–10.3)
Creatinine, Ser: 1.04 mg/dL — ABNORMAL HIGH (ref 0.44–1.00)
GFR calc Af Amer: >60 mL/min (ref >60)
GFR calc non Af Amer: >60 mL/min (ref >60)
Glucose, Bld: 495 mg/dL — ABNORMAL HIGH (ref 65–99)
POTASSIUM: 3.4 mmol/L — AB (ref 3.5–5.1)
Sodium: 130 mmol/L — ABNORMAL LOW (ref 135–145)
Total Bilirubin: 1.1 mg/dL (ref 0.3–1.2)
Total Protein: 8.5 g/dL — ABNORMAL HIGH (ref 6.5–8.1)

## 2015-02-24 LAB — URINE MICROSCOPIC-ADD ON

## 2015-02-24 LAB — URINALYSIS, ROUTINE W REFLEX MICROSCOPIC
Bilirubin Urine: NEGATIVE
Glucose, UA: >1000 mg/dL — AB
Ketones, ur: 15 mg/dL — AB
Leukocytes, UA: NEGATIVE
NITRITE: POSITIVE — AB
PROTEIN: NEGATIVE mg/dL
Urobilinogen, UA: 0.2 mg/dL (ref 0.0–1.0)
pH: 5.5 (ref 5.0–8.0)

## 2015-02-24 LAB — CBC WITH DIFFERENTIAL/PLATELET
Basophils Absolute: 0 10*3/uL (ref 0.0–0.1)
Basophils Relative: 0 % (ref 0–1)
Eosinophils Absolute: 0 10*3/uL (ref 0.0–0.7)
Eosinophils Relative: 0 % (ref 0–5)
HCT: 40.5 % (ref 36.0–46.0)
HEMOGLOBIN: 13.9 g/dL (ref 12.0–15.0)
LYMPHS PCT: 12 % (ref 12–46)
Lymphs Abs: 1.8 10*3/uL (ref 0.7–4.0)
MCH: 29.8 pg (ref 26.0–34.0)
MCHC: 34.3 g/dL (ref 30.0–36.0)
MCV: 86.9 fL (ref 78.0–100.0)
MONOS PCT: 6 % (ref 3–12)
Monocytes Absolute: 0.9 10*3/uL (ref 0.1–1.0)
Neutro Abs: 12.4 10*3/uL — ABNORMAL HIGH (ref 1.7–7.7)
Neutrophils Relative %: 82 % — ABNORMAL HIGH (ref 43–77)
PLATELETS: 264 10*3/uL (ref 150–400)
RBC: 4.66 MIL/uL (ref 3.87–5.11)
RDW: 12.1 % (ref 11.5–15.5)
WBC: 15 10*3/uL — AB (ref 4.0–10.5)

## 2015-02-24 LAB — CBG MONITORING, ED
GLUCOSE-CAPILLARY: 283 mg/dL — AB (ref 65–99)
Glucose-Capillary: 491 mg/dL — ABNORMAL HIGH (ref 65–99)

## 2015-02-24 LAB — LIPASE, BLOOD: Lipase: 17 U/L — ABNORMAL LOW (ref 22–51)

## 2015-02-24 MED ORDER — FENTANYL CITRATE (PF) 100 MCG/2ML IJ SOLN
50.0000 ug | Freq: Once | INTRAMUSCULAR | Status: AC
  Administered 2015-02-24: 50 ug via INTRAVENOUS
  Filled 2015-02-24: qty 2

## 2015-02-24 MED ORDER — SODIUM CHLORIDE 0.9 % IV BOLUS (SEPSIS): 1000.0000 mL | Freq: Once | INTRAVENOUS | Status: DC

## 2015-02-24 MED ORDER — HYDROCODONE-ACETAMINOPHEN 5-325 MG PO TABS: 1.0000 | ORAL_TABLET | Freq: Four times a day (QID) | ORAL | Status: DC | PRN

## 2015-02-24 MED ORDER — ONDANSETRON HCL 4 MG/2ML IJ SOLN
4.0000 mg | Freq: Once | INTRAMUSCULAR | Status: AC
  Administered 2015-02-24: 4 mg via INTRAVENOUS
  Filled 2015-02-24: qty 2

## 2015-02-24 MED ORDER — DEXTROSE 5 % IV SOLN
1.0000 g | Freq: Once | INTRAVENOUS | Status: AC
  Administered 2015-02-24: 1 g via INTRAVENOUS
  Filled 2015-02-24: qty 10

## 2015-02-24 MED ORDER — SODIUM CHLORIDE 0.9 % IV BOLUS (SEPSIS)
1000.0000 mL | Freq: Once | INTRAVENOUS | Status: AC
  Administered 2015-02-24: 1000 mL via INTRAVENOUS

## 2015-02-24 MED ORDER — SODIUM CHLORIDE 0.9 % IV SOLN
Freq: Once | INTRAVENOUS | Status: AC
  Administered 2015-02-24: 19:00:00 via INTRAVENOUS

## 2015-02-24 MED ORDER — SODIUM CHLORIDE 0.9 % IV SOLN: INTRAVENOUS | Status: DC

## 2015-02-24 MED ORDER — CEPHALEXIN 500 MG PO CAPS: 500.0000 mg | ORAL_CAPSULE | Freq: Four times a day (QID) | ORAL | Status: DC

## 2015-02-24 MED ORDER — INSULIN ASPART 100 UNIT/ML ~~LOC~~ SOLN
10.0000 [IU] | Freq: Once | SUBCUTANEOUS | Status: AC
  Administered 2015-02-24: 10 [IU] via INTRAVENOUS
  Filled 2015-02-24: qty 1

## 2015-02-24 NOTE — ED Provider Notes (Signed)
CSN: [id number]     Arrival date & time 02/24/15  1751 History   First MD Initiated Contact with Patient 02/24/15 1847     Chief Complaint  Patient presents with  . Generalized Body Aches  . Hyperglycemia     (Consider location/radiation/quality/duration/timing/severity/associated sxs/prior Treatment) Patient is a 49 y.o. female presenting with hyperglycemia. The history is provided by the patient.  Hyperglycemia Associated symptoms: fever   Associated symptoms: no abdominal pain, no chest pain, no confusion, no dysuria, no nausea, no shortness of breath and no vomiting    patient states that she has a long-standing of history of hyperglycemia followed by endocrine and Durwin Nora not been able to get her blood sugars under control. New complaint is cramping all over the body. These symptoms started yesterday morning. No nausea no vomiting feeling of fever. Feeling of congestion and slight cough. Cough is actually been ongoing for a period of time. Patient is on insulin and oral hypoglycemics. No nausea no vomiting no chest pain no shortness of breath. No dysuria.  Past Medical History  Diagnosis Date  . Asthma   . Diabetes mellitus without complication   . Hypertension   . Hyperlipidemia   . Arthritis   . Premenopause menorrhagia    Past Surgical History  Procedure Laterality Date  . Back surgery     Family History  Problem Relation Age of Onset  . Hypertension Mother   . Hyperlipidemia Mother   . Diabetes Mother   . Stroke Father   . Heart disease Father   . Heart attack Father   . Cancer Sister     leukemia  . Diabetes Sister   . Cancer Brother   . Diabetes Brother   . Diabetes Maternal Grandmother   . Cancer Maternal Grandmother   . Alzheimer's disease Maternal Grandmother   . Cancer Sister   . Diabetes Sister   . Cancer Sister   . Diabetes Sister   . Diabetes Sister   . Diabetes Sister   . Diabetes Sister   . Diabetes Sister   . Cancer Brother   . Diabetes  Brother   . Diabetes Brother   . Diabetes Brother   . Diabetes Brother   . Heart disease Brother   . Diabetes Brother   . Cancer Brother     colon  . Diabetes Brother   . Hyperlipidemia Brother   . Diabetes Brother   . Diabetes Brother    History  Substance Use Topics  . Smoking status: Never Smoker   . Smokeless tobacco: Never Used  . Alcohol Use: No   OB History    No data available     Review of Systems  Constitutional: Positive for fever.  HENT: Positive for congestion.   Eyes: Negative for visual disturbance.  Respiratory: Positive for cough. Negative for shortness of breath.   Cardiovascular: Negative for chest pain.  Gastrointestinal: Negative for nausea, vomiting and abdominal pain.  Genitourinary: Negative for dysuria.  Musculoskeletal: Positive for myalgias.  Skin: Negative for rash.  Neurological: Negative for headaches.  Hematological: Does not bruise/bleed easily.  Psychiatric/Behavioral: Negative for confusion.      Allergies  Review of patient's allergies indicates no known allergies.  Home Medications   Prior to Admission medications   Medication Sig Start Date End Date Taking? Authorizing Provider  albuterol (PROVENTIL HFA;VENTOLIN HFA) 108 (90 BASE) MCG/ACT inhaler Inhale 2 puffs into the lungs every 6 (six) hours as needed for wheezing or shortness of breath.  Yes Historical Provider, MD  albuterol (PROVENTIL) (2.5 MG/3ML) 0.083% nebulizer solution Take 2.5 mg by nebulization every 6 (six) hours as needed for wheezing or shortness of breath.   Yes Historical Provider, MD  aspirin EC 81 MG tablet Take 81 mg by mouth daily.   Yes Historical Provider, MD  glipiZIDE (GLUCOTROL) 10 MG tablet Take 10 mg by mouth daily.    Yes Historical Provider, MD  lisinopril-hydrochlorothiazide (ZESTORETIC) 20-12.5 MG per tablet Take 1 tablet by mouth daily. 06/04/14  Yes Deatra Canter, FNP  medroxyPROGESTERone (DEPO-PROVERA) 150 MG/ML injection Inject 150 mg  into the muscle every 3 (three) months.   Yes Historical Provider, MD  metFORMIN (GLUCOPHAGE) 1000 MG tablet Take 1,000 mg by mouth 2 (two) times daily with a meal.   Yes Historical Provider, MD  Multiple Vitamins-Calcium (ONE-A-DAY WOMENS PO) Take 1 tablet by mouth daily.   Yes Historical Provider, MD  rosuvastatin (CRESTOR) 20 MG tablet Take 20 mg by mouth at bedtime.    Yes Historical Provider, MD  Aspirin-Salicylamide-Caffeine (BC HEADACHE) 325-95-16 MG TABS Take 1 packet by mouth daily as needed (for pain).    Historical Provider, MD  ciprofloxacin (CIPRO) 500 MG tablet Take 1 tablet (500 mg total) by mouth 2 (two) times daily. Patient not taking: Reported on 09/05/2014 06/04/14   Deatra Canter, FNP  gabapentin (NEURONTIN) 600 MG tablet Take 600 mg by mouth daily as needed (for neuropathy/pain).     Historical Provider, MD  loratadine (CLARITIN) 10 MG tablet Take 10 mg by mouth daily as needed for allergies.    Historical Provider, MD  medroxyPROGESTERone (PROVERA) 10 MG tablet Take 1 tablet (10 mg total) by mouth daily. Take x 14 days each month. Patient not taking: Reported on 09/05/2014 04/23/14   Tilda Burrow, MD  metroNIDAZOLE (FLAGYL) 500 MG tablet Take 1 tablet (500 mg total) by mouth 2 (two) times daily. One po bid x 7 days Patient not taking: Reported on 02/24/2015 09/05/14   Mancel Bale, MD   BP 113/77 mmHg  Pulse 102  Temp(Src) 99.5 F (37.5 C) (Oral)  Resp 24  Ht 5\' 7"  (1.702 m)  Wt 168 lb (76.204 kg)  BMI 26.31 kg/m2  SpO2 98%  LMP 02/24/2015 Physical Exam  Constitutional: She is oriented to person, place, and time. She appears well-developed and well-nourished. No distress.  HENT:  Head: Normocephalic and atraumatic.  Mouth/Throat: Oropharynx is clear and moist.  Eyes: Conjunctivae and EOM are normal. Pupils are equal, round, and reactive to light.  Neck: Normal range of motion.  Cardiovascular: Regular rhythm and normal heart sounds.   No murmur heard. Tachycardic   Pulmonary/Chest: Effort normal and breath sounds normal. No respiratory distress. She has no wheezes.  Abdominal: Soft. Bowel sounds are normal. There is no tenderness.  Musculoskeletal: Normal range of motion. She exhibits no edema.  Neurological: She is alert and oriented to person, place, and time. No cranial nerve deficit. She exhibits normal muscle tone. Coordination normal.  Skin: Skin is warm. No rash noted.  Nursing note and vitals reviewed.   ED Course  Procedures (including critical care time) Labs Review Labs Reviewed  COMPREHENSIVE METABOLIC PANEL - Abnormal; Notable for the following:    Sodium 130 (*)    Potassium 3.4 (*)    Chloride 95 (*)    Glucose, Bld 495 (*)    Creatinine, Ser 1.04 (*)    Total Protein 8.5 (*)    All other components within normal limits  CBC WITH DIFFERENTIAL/PLATELET - Abnormal; Notable for the following:    WBC 15.0 (*)    Neutrophils Relative % 82 (*)    Neutro Abs 12.4 (*)    All other components within normal limits  URINALYSIS, ROUTINE W REFLEX MICROSCOPIC (NOT AT [facility]) - Abnormal; Notable for the following:    APPearance HAZY (*)    Specific Gravity, Urine <1.005 (*)    Glucose, UA >1000 (*)    Hgb urine dipstick LARGE (*)    Ketones, ur 15 (*)    Nitrite POSITIVE (*)    All other components within normal limits  LIPASE, BLOOD - Abnormal; Notable for the following:    Lipase 17 (*)    All other components within normal limits  URINE MICROSCOPIC-ADD ON - Abnormal; Notable for the following:    Squamous Epithelial / LPF FEW (*)    Bacteria, UA MANY (*)    All other components within normal limits  CBG MONITORING, ED - Abnormal; Notable for the following:    Glucose-Capillary 491 (*)    All other components within normal limits  CBG MONITORING, ED - Abnormal; Notable for the following:    Glucose-Capillary 283 (*)    All other components within normal limits  URINE CULTURE   Results for orders placed or performed during the  hospital encounter of 02/24/15  Comprehensive metabolic panel  Result Value Ref Range   Sodium 130 (L) 135 - 145 mmol/L   Potassium 3.4 (L) 3.5 - 5.1 mmol/L   Chloride 95 (L) 101 - 111 mmol/L   CO2 23 22 - 32 mmol/L   Glucose, Bld 495 (H) 65 - 99 mg/dL   BUN 8 6 - 20 mg/dL   Creatinine, Ser 1.03 (H) 0.44 - 1.00 mg/dL   Calcium 9.6 8.9 - 15.9 mg/dL   Total Protein 8.5 (H) 6.5 - 8.1 g/dL   Albumin 3.8 3.5 - 5.0 g/dL   AST 25 15 - 41 U/L   ALT 22 14 - 54 U/L   Alkaline Phosphatase 121 38 - 126 U/L   Total Bilirubin 1.1 0.3 - 1.2 mg/dL   GFR calc non Af Amer >60 >60 mL/min   GFR calc Af Amer >60 >60 mL/min   Anion gap 12 5 - 15  CBC with Differential  Result Value Ref Range   WBC 15.0 (H) 4.0 - 10.5 K/uL   RBC 4.66 3.87 - 5.11 MIL/uL   Hemoglobin 13.9 12.0 - 15.0 g/dL   HCT 45.8 59.2 - 92.4 %   MCV 86.9 78.0 - 100.0 fL   MCH 29.8 26.0 - 34.0 pg   MCHC 34.3 30.0 - 36.0 g/dL   RDW 46.2 86.3 - 81.7 %   Platelets 264 150 - 400 K/uL   Neutrophils Relative % 82 (H) 43 - 77 %   Neutro Abs 12.4 (H) 1.7 - 7.7 K/uL   Lymphocytes Relative 12 12 - 46 %   Lymphs Abs 1.8 0.7 - 4.0 K/uL   Monocytes Relative 6 3 - 12 %   Monocytes Absolute 0.9 0.1 - 1.0 K/uL   Eosinophils Relative 0 0 - 5 %   Eosinophils Absolute 0.0 0.0 - 0.7 K/uL   Basophils Relative 0 0 - 1 %   Basophils Absolute 0.0 0.0 - 0.1 K/uL  Urinalysis, Routine w reflex microscopic (not at [facility])  Result Value Ref Range   Color, Urine YELLOW YELLOW   APPearance HAZY (A) CLEAR   Specific Gravity, Urine <1.005 (L) 1.005 -  1.030   pH 5.5 5.0 - 8.0   Glucose, UA >1000 (A) NEGATIVE mg/dL   Hgb urine dipstick LARGE (A) NEGATIVE   Bilirubin Urine NEGATIVE NEGATIVE   Ketones, ur 15 (A) NEGATIVE mg/dL   Protein, ur NEGATIVE NEGATIVE mg/dL   Urobilinogen, UA 0.2 0.0 - 1.0 mg/dL   Nitrite POSITIVE (A) NEGATIVE   Leukocytes, UA NEGATIVE NEGATIVE  Lipase, blood  Result Value Ref Range   Lipase 17 (L) 22 - 51 U/L  Urine  microscopic-add on  Result Value Ref Range   Squamous Epithelial / LPF FEW (A) RARE   WBC, UA 11-20 <3 WBC/hpf   RBC / HPF TOO NUMEROUS TO COUNT <3 RBC/hpf   Bacteria, UA MANY (A) RARE   Urine-Other RARE YEAST   CBG monitoring, ED  Result Value Ref Range   Glucose-Capillary 491 (H) 65 - 99 mg/dL  POC CBG, ED  Result Value Ref Range   Glucose-Capillary 283 (H) 65 - 99 mg/dL   Comment 1 Document in Chart      Imaging Review Dg Chest 2 View  02/24/2015   CLINICAL DATA:  Cough, fever.  EXAM: CHEST  2 VIEW  COMPARISON:  June 19, 2013.  FINDINGS: The heart size and mediastinal contours are within normal limits. Both lungs are clear. No pneumothorax or pleural effusion is noted. The visualized skeletal structures are unremarkable.  IMPRESSION: No active cardiopulmonary disease.   Electronically Signed   By: Lupita Raider, M.D.   On: 02/24/2015 19:26     EKG Interpretation None      ED ECG REPORT   Date: 02/24/2015  Rate: 117  Rhythm: sinus tachycardia  QRS Axis: normal  Intervals: normal  ST/T Wave abnormalities: nonspecific T wave changes  Conduction Disutrbances:none  Narrative Interpretation:   Old EKG Reviewed: changes noted  I have personally reviewed the EKG tracing and agree with the computerized printout as noted.     MDM   Final diagnoses:  Cough  Hyperglycemia  Myalgia  UTI (lower urinary tract infection)    Patient was significant improvement with IV normal saline 1 L bolus and 10 units of intravenous regular insulin. Blood sugar dropped from just shy of 500 down to 280. Patient with pain medicine also significant improvement in the myalgias. Patient does have a leukocytosis patient had marked hyperglycemia but no evidence of diabetic ketoacidosis. Heart rate on presentation was 135 now down to 102. We'll give a second liter of fluid. Patient should be stable for discharge home with follow-up with her doctor. Patient is followed by  endocrinology.  Patient did give a history of some coughing congestion however chest x-ray is negative for any acute abnormalities no evidence of pneumonia. Patient does have a history of asthma but no wheezing currently. Oxygen saturation is her fine. No hypotension.  Urinalysis was pending it is now back consistent with urinary tract infection. This probably explains a lot of his symptomatology. Will treat here with IV antibodies and discharged home with oral anti-bodyaches patient has no known allergies so we'll treat with IV Rocephin and Keflex. We'll do a culture the urine. We'll have patient follow-up with her [place] care [place] in [place]. Patient will return if not improving in 2 days.  Vanetta Mulders, MD 02/24/15 2127

## 2015-02-24 NOTE — Discharge Instructions (Signed)
Make an appointment to follow-up with your diabetic doctor to have your blood sugars followed up. Urinalysis consistent with urinary tract infection. Take Keflex for the next 7 days. You should be improved in 2 days if not follow-up here or with your regular doctor. Certainly return for any new or worse symptoms. Take pain medicine as needed.

## 2015-02-24 NOTE — ED Notes (Addendum)
Patient complaining of "cramping all over my body." Also states her sugar was 358 today at home. CBG 491 at triage. Patient states she did not take her insulin today.

## 2015-02-28 LAB — URINE CULTURE: Culture: >=100000

## 2015-03-02 ENCOUNTER — Telehealth (HOSPITAL_COMMUNITY): Payer: Self-pay

## 2015-03-02 NOTE — Telephone Encounter (Signed)
Post ED Visit - Positive Culture Follow-up  Culture report reviewed by antimicrobial stewardship pharmacist: []  Wes Dulaney, Pharm.D., BCPS [x]  Celedonio MiyamotoJeremy Frens, Pharm.D., BCPS []  Georgina PillionElizabeth Martin, Pharm.D., BCPS []  [place]ottsvilleMinh Pham, [address]Ph[address]D., BCPS, AAHIVP []  Estella HuskMichelle Turner, Pharm.D., BCPS, AAHIVP []  Elder CyphersLorie Poole, [address]Ph[address]D., BCPS  Positive urine culture Treated with cephalexin, organism sensitive to the same and no further patient follow-up is required at this time.  Ashley JacobsFesterman, Jakolby Sedivy C 03/02/2015, 1:38 PM

## 2015-03-19 DIAGNOSIS — E870 Hyperosmolality and hypernatremia: Secondary | ICD-10-CM | POA: Diagnosis not present

## 2015-03-19 DIAGNOSIS — R079 Chest pain, unspecified: Secondary | ICD-10-CM | POA: Diagnosis not present

## 2015-03-19 DIAGNOSIS — E785 Hyperlipidemia, unspecified: Secondary | ICD-10-CM | POA: Diagnosis not present

## 2015-03-19 DIAGNOSIS — K8044 Calculus of bile duct with chronic cholecystitis without obstruction: Secondary | ICD-10-CM | POA: Diagnosis not present

## 2015-03-19 DIAGNOSIS — R109 Unspecified abdominal pain: Secondary | ICD-10-CM | POA: Diagnosis not present

## 2015-03-19 DIAGNOSIS — J45909 Unspecified asthma, uncomplicated: Secondary | ICD-10-CM | POA: Diagnosis not present

## 2015-03-19 DIAGNOSIS — E1165 Type 2 diabetes mellitus with hyperglycemia: Secondary | ICD-10-CM | POA: Diagnosis not present

## 2015-03-19 DIAGNOSIS — I10 Essential (primary) hypertension: Secondary | ICD-10-CM | POA: Diagnosis not present

## 2015-03-19 DIAGNOSIS — K219 Gastro-esophageal reflux disease without esophagitis: Secondary | ICD-10-CM | POA: Diagnosis not present

## 2015-03-19 DIAGNOSIS — R072 Precordial pain: Secondary | ICD-10-CM | POA: Diagnosis not present

## 2015-03-19 DIAGNOSIS — E1100 Type 2 diabetes mellitus with hyperosmolarity without nonketotic hyperglycemic-hyperosmolar coma (NKHHC): Secondary | ICD-10-CM | POA: Diagnosis not present

## 2015-03-20 DIAGNOSIS — K8044 Calculus of bile duct with chronic cholecystitis without obstruction: Secondary | ICD-10-CM | POA: Diagnosis not present

## 2015-03-20 DIAGNOSIS — E1165 Type 2 diabetes mellitus with hyperglycemia: Secondary | ICD-10-CM | POA: Diagnosis not present

## 2015-03-20 DIAGNOSIS — K8010 Calculus of gallbladder with chronic cholecystitis without obstruction: Secondary | ICD-10-CM | POA: Diagnosis not present

## 2015-03-20 DIAGNOSIS — K8020 Calculus of gallbladder without cholecystitis without obstruction: Secondary | ICD-10-CM | POA: Diagnosis not present

## 2015-03-20 DIAGNOSIS — I10 Essential (primary) hypertension: Secondary | ICD-10-CM | POA: Diagnosis not present

## 2015-03-20 DIAGNOSIS — R109 Unspecified abdominal pain: Secondary | ICD-10-CM | POA: Diagnosis not present

## 2015-03-21 DIAGNOSIS — K8010 Calculus of gallbladder with chronic cholecystitis without obstruction: Secondary | ICD-10-CM | POA: Diagnosis not present

## 2015-03-21 DIAGNOSIS — E1165 Type 2 diabetes mellitus with hyperglycemia: Secondary | ICD-10-CM | POA: Diagnosis not present

## 2015-03-21 DIAGNOSIS — K8044 Calculus of bile duct with chronic cholecystitis without obstruction: Secondary | ICD-10-CM | POA: Diagnosis not present

## 2015-03-21 DIAGNOSIS — E785 Hyperlipidemia, unspecified: Secondary | ICD-10-CM | POA: Diagnosis not present

## 2015-03-21 DIAGNOSIS — K810 Acute cholecystitis: Secondary | ICD-10-CM | POA: Diagnosis not present

## 2015-03-21 DIAGNOSIS — K219 Gastro-esophageal reflux disease without esophagitis: Secondary | ICD-10-CM | POA: Diagnosis not present

## 2015-03-21 DIAGNOSIS — R109 Unspecified abdominal pain: Secondary | ICD-10-CM | POA: Diagnosis not present

## 2015-03-21 DIAGNOSIS — I10 Essential (primary) hypertension: Secondary | ICD-10-CM | POA: Diagnosis not present

## 2015-03-21 DIAGNOSIS — R079 Chest pain, unspecified: Secondary | ICD-10-CM | POA: Diagnosis not present

## 2015-03-21 DIAGNOSIS — J45909 Unspecified asthma, uncomplicated: Secondary | ICD-10-CM | POA: Diagnosis not present

## 2015-03-22 DIAGNOSIS — K8010 Calculus of gallbladder with chronic cholecystitis without obstruction: Secondary | ICD-10-CM | POA: Diagnosis not present

## 2015-03-22 DIAGNOSIS — E1100 Type 2 diabetes mellitus with hyperosmolarity without nonketotic hyperglycemic-hyperosmolar coma (NKHHC): Secondary | ICD-10-CM | POA: Diagnosis not present

## 2015-04-26 DIAGNOSIS — Z825 Family history of asthma and other chronic lower respiratory diseases: Secondary | ICD-10-CM | POA: Diagnosis not present

## 2015-04-26 DIAGNOSIS — M5416 Radiculopathy, lumbar region: Secondary | ICD-10-CM | POA: Diagnosis not present

## 2015-04-26 DIAGNOSIS — I10 Essential (primary) hypertension: Secondary | ICD-10-CM | POA: Diagnosis not present

## 2015-04-26 DIAGNOSIS — Z794 Long term (current) use of insulin: Secondary | ICD-10-CM | POA: Diagnosis not present

## 2015-04-26 DIAGNOSIS — Z79899 Other long term (current) drug therapy: Secondary | ICD-10-CM | POA: Diagnosis not present

## 2015-04-26 DIAGNOSIS — J45909 Unspecified asthma, uncomplicated: Secondary | ICD-10-CM | POA: Diagnosis not present

## 2015-04-26 DIAGNOSIS — M5116 Intervertebral disc disorders with radiculopathy, lumbar region: Secondary | ICD-10-CM | POA: Diagnosis not present

## 2015-04-26 DIAGNOSIS — E119 Type 2 diabetes mellitus without complications: Secondary | ICD-10-CM | POA: Diagnosis not present

## 2015-04-26 DIAGNOSIS — M79605 Pain in left leg: Secondary | ICD-10-CM | POA: Diagnosis not present

## 2015-04-26 DIAGNOSIS — R200 Anesthesia of skin: Secondary | ICD-10-CM | POA: Diagnosis not present

## 2015-04-26 DIAGNOSIS — M5126 Other intervertebral disc displacement, lumbar region: Secondary | ICD-10-CM | POA: Diagnosis not present

## 2015-05-17 DIAGNOSIS — K219 Gastro-esophageal reflux disease without esophagitis: Secondary | ICD-10-CM | POA: Diagnosis not present

## 2015-05-17 DIAGNOSIS — E1365 Other specified diabetes mellitus with hyperglycemia: Secondary | ICD-10-CM | POA: Diagnosis not present

## 2015-05-27 DIAGNOSIS — M5126 Other intervertebral disc displacement, lumbar region: Secondary | ICD-10-CM | POA: Diagnosis not present

## 2015-06-02 DIAGNOSIS — M5136 Other intervertebral disc degeneration, lumbar region: Secondary | ICD-10-CM | POA: Diagnosis not present

## 2015-06-02 DIAGNOSIS — M47816 Spondylosis without myelopathy or radiculopathy, lumbar region: Secondary | ICD-10-CM | POA: Diagnosis not present

## 2015-06-02 DIAGNOSIS — Z9889 Other specified postprocedural states: Secondary | ICD-10-CM | POA: Diagnosis not present

## 2015-06-02 DIAGNOSIS — M545 Low back pain: Secondary | ICD-10-CM | POA: Diagnosis not present

## 2015-06-02 DIAGNOSIS — M5126 Other intervertebral disc displacement, lumbar region: Secondary | ICD-10-CM | POA: Diagnosis not present

## 2015-06-08 DIAGNOSIS — B3781 Candidal esophagitis: Secondary | ICD-10-CM | POA: Diagnosis not present

## 2015-06-08 DIAGNOSIS — K219 Gastro-esophageal reflux disease without esophagitis: Secondary | ICD-10-CM | POA: Diagnosis not present

## 2015-06-08 DIAGNOSIS — R12 Heartburn: Secondary | ICD-10-CM | POA: Diagnosis not present

## 2015-06-08 DIAGNOSIS — K208 Other esophagitis: Secondary | ICD-10-CM | POA: Diagnosis not present

## 2015-06-08 DIAGNOSIS — R112 Nausea with vomiting, unspecified: Secondary | ICD-10-CM | POA: Diagnosis not present

## 2015-06-08 DIAGNOSIS — R634 Abnormal weight loss: Secondary | ICD-10-CM | POA: Diagnosis not present

## 2015-06-08 DIAGNOSIS — I10 Essential (primary) hypertension: Secondary | ICD-10-CM | POA: Diagnosis not present

## 2015-06-09 DIAGNOSIS — M5126 Other intervertebral disc displacement, lumbar region: Secondary | ICD-10-CM | POA: Diagnosis not present

## 2015-06-17 DIAGNOSIS — J449 Chronic obstructive pulmonary disease, unspecified: Secondary | ICD-10-CM | POA: Diagnosis not present

## 2015-06-17 DIAGNOSIS — Z809 Family history of malignant neoplasm, unspecified: Secondary | ICD-10-CM | POA: Diagnosis not present

## 2015-06-17 DIAGNOSIS — E119 Type 2 diabetes mellitus without complications: Secondary | ICD-10-CM | POA: Diagnosis not present

## 2015-06-17 DIAGNOSIS — I10 Essential (primary) hypertension: Secondary | ICD-10-CM | POA: Diagnosis not present

## 2015-06-17 DIAGNOSIS — Z7984 Long term (current) use of oral hypoglycemic drugs: Secondary | ICD-10-CM | POA: Diagnosis not present

## 2015-06-17 DIAGNOSIS — Z8249 Family history of ischemic heart disease and other diseases of the circulatory system: Secondary | ICD-10-CM | POA: Diagnosis not present

## 2015-06-17 DIAGNOSIS — M5126 Other intervertebral disc displacement, lumbar region: Secondary | ICD-10-CM | POA: Diagnosis not present

## 2015-06-17 DIAGNOSIS — Z7951 Long term (current) use of inhaled steroids: Secondary | ICD-10-CM | POA: Diagnosis not present

## 2015-06-17 DIAGNOSIS — Z23 Encounter for immunization: Secondary | ICD-10-CM | POA: Diagnosis not present

## 2015-06-17 DIAGNOSIS — Z79899 Other long term (current) drug therapy: Secondary | ICD-10-CM | POA: Diagnosis not present

## 2015-06-20 DIAGNOSIS — M5126 Other intervertebral disc displacement, lumbar region: Secondary | ICD-10-CM | POA: Diagnosis not present

## 2015-06-20 DIAGNOSIS — J449 Chronic obstructive pulmonary disease, unspecified: Secondary | ICD-10-CM | POA: Diagnosis not present

## 2015-06-20 DIAGNOSIS — Z8249 Family history of ischemic heart disease and other diseases of the circulatory system: Secondary | ICD-10-CM | POA: Diagnosis not present

## 2015-06-20 DIAGNOSIS — Z809 Family history of malignant neoplasm, unspecified: Secondary | ICD-10-CM | POA: Diagnosis not present

## 2015-06-20 DIAGNOSIS — E119 Type 2 diabetes mellitus without complications: Secondary | ICD-10-CM | POA: Diagnosis not present

## 2015-06-20 DIAGNOSIS — I10 Essential (primary) hypertension: Secondary | ICD-10-CM | POA: Diagnosis not present

## 2015-06-20 DIAGNOSIS — Z9889 Other specified postprocedural states: Secondary | ICD-10-CM | POA: Diagnosis not present

## 2015-06-21 DIAGNOSIS — J449 Chronic obstructive pulmonary disease, unspecified: Secondary | ICD-10-CM | POA: Diagnosis not present

## 2015-06-21 DIAGNOSIS — M5126 Other intervertebral disc displacement, lumbar region: Secondary | ICD-10-CM | POA: Diagnosis not present

## 2015-06-21 DIAGNOSIS — Z8249 Family history of ischemic heart disease and other diseases of the circulatory system: Secondary | ICD-10-CM | POA: Diagnosis not present

## 2015-06-21 DIAGNOSIS — E119 Type 2 diabetes mellitus without complications: Secondary | ICD-10-CM | POA: Diagnosis not present

## 2015-06-21 DIAGNOSIS — Z809 Family history of malignant neoplasm, unspecified: Secondary | ICD-10-CM | POA: Diagnosis not present

## 2015-06-21 DIAGNOSIS — I10 Essential (primary) hypertension: Secondary | ICD-10-CM | POA: Diagnosis not present

## 2015-07-05 DIAGNOSIS — Z79899 Other long term (current) drug therapy: Secondary | ICD-10-CM | POA: Diagnosis not present

## 2015-07-05 DIAGNOSIS — Z7982 Long term (current) use of aspirin: Secondary | ICD-10-CM | POA: Diagnosis not present

## 2015-07-05 DIAGNOSIS — E1169 Type 2 diabetes mellitus with other specified complication: Secondary | ICD-10-CM | POA: Diagnosis not present

## 2015-07-05 DIAGNOSIS — I10 Essential (primary) hypertension: Secondary | ICD-10-CM | POA: Diagnosis not present

## 2015-07-05 DIAGNOSIS — E1165 Type 2 diabetes mellitus with hyperglycemia: Secondary | ICD-10-CM | POA: Diagnosis not present

## 2015-07-05 DIAGNOSIS — Z794 Long term (current) use of insulin: Secondary | ICD-10-CM | POA: Diagnosis not present

## 2015-07-05 DIAGNOSIS — J45909 Unspecified asthma, uncomplicated: Secondary | ICD-10-CM | POA: Diagnosis not present

## 2015-07-05 DIAGNOSIS — E785 Hyperlipidemia, unspecified: Secondary | ICD-10-CM | POA: Diagnosis not present

## 2015-07-05 DIAGNOSIS — E1159 Type 2 diabetes mellitus with other circulatory complications: Secondary | ICD-10-CM | POA: Diagnosis not present

## 2015-09-30 DIAGNOSIS — M5126 Other intervertebral disc displacement, lumbar region: Secondary | ICD-10-CM | POA: Diagnosis not present

## 2015-11-04 DIAGNOSIS — E1165 Type 2 diabetes mellitus with hyperglycemia: Secondary | ICD-10-CM | POA: Diagnosis not present

## 2015-11-04 DIAGNOSIS — Z794 Long term (current) use of insulin: Secondary | ICD-10-CM | POA: Diagnosis not present

## 2016-01-14 DIAGNOSIS — Z7984 Long term (current) use of oral hypoglycemic drugs: Secondary | ICD-10-CM | POA: Diagnosis not present

## 2016-01-14 DIAGNOSIS — J45909 Unspecified asthma, uncomplicated: Secondary | ICD-10-CM | POA: Diagnosis not present

## 2016-01-14 DIAGNOSIS — R591 Generalized enlarged lymph nodes: Secondary | ICD-10-CM | POA: Diagnosis not present

## 2016-01-14 DIAGNOSIS — M79671 Pain in right foot: Secondary | ICD-10-CM | POA: Diagnosis not present

## 2016-01-14 DIAGNOSIS — N390 Urinary tract infection, site not specified: Secondary | ICD-10-CM | POA: Diagnosis not present

## 2016-01-14 DIAGNOSIS — E7800 Pure hypercholesterolemia, unspecified: Secondary | ICD-10-CM | POA: Diagnosis not present

## 2016-01-14 DIAGNOSIS — R1011 Right upper quadrant pain: Secondary | ICD-10-CM | POA: Diagnosis not present

## 2016-01-14 DIAGNOSIS — K219 Gastro-esophageal reflux disease without esophagitis: Secondary | ICD-10-CM | POA: Diagnosis not present

## 2016-01-14 DIAGNOSIS — M79604 Pain in right leg: Secondary | ICD-10-CM | POA: Diagnosis not present

## 2016-01-14 DIAGNOSIS — R590 Localized enlarged lymph nodes: Secondary | ICD-10-CM | POA: Diagnosis not present

## 2016-01-14 DIAGNOSIS — Z7982 Long term (current) use of aspirin: Secondary | ICD-10-CM | POA: Diagnosis not present

## 2016-01-14 DIAGNOSIS — R319 Hematuria, unspecified: Secondary | ICD-10-CM | POA: Diagnosis not present

## 2016-01-14 DIAGNOSIS — Z825 Family history of asthma and other chronic lower respiratory diseases: Secondary | ICD-10-CM | POA: Diagnosis not present

## 2016-01-14 DIAGNOSIS — Z794 Long term (current) use of insulin: Secondary | ICD-10-CM | POA: Diagnosis not present

## 2016-01-14 DIAGNOSIS — M545 Low back pain: Secondary | ICD-10-CM | POA: Diagnosis not present

## 2016-01-14 DIAGNOSIS — Z9851 Tubal ligation status: Secondary | ICD-10-CM | POA: Diagnosis not present

## 2016-01-14 DIAGNOSIS — M79672 Pain in left foot: Secondary | ICD-10-CM | POA: Diagnosis not present

## 2016-01-14 DIAGNOSIS — R109 Unspecified abdominal pain: Secondary | ICD-10-CM | POA: Diagnosis not present

## 2016-01-14 DIAGNOSIS — E119 Type 2 diabetes mellitus without complications: Secondary | ICD-10-CM | POA: Diagnosis not present

## 2016-01-14 DIAGNOSIS — R1010 Upper abdominal pain, unspecified: Secondary | ICD-10-CM | POA: Diagnosis not present

## 2016-01-14 DIAGNOSIS — M79605 Pain in left leg: Secondary | ICD-10-CM | POA: Diagnosis not present

## 2016-01-14 DIAGNOSIS — Z79899 Other long term (current) drug therapy: Secondary | ICD-10-CM | POA: Diagnosis not present

## 2016-01-14 DIAGNOSIS — Z9889 Other specified postprocedural states: Secondary | ICD-10-CM | POA: Diagnosis not present

## 2016-01-14 DIAGNOSIS — R079 Chest pain, unspecified: Secondary | ICD-10-CM | POA: Diagnosis not present

## 2016-02-27 DIAGNOSIS — K219 Gastro-esophageal reflux disease without esophagitis: Secondary | ICD-10-CM | POA: Diagnosis not present

## 2016-02-27 DIAGNOSIS — J4540 Moderate persistent asthma, uncomplicated: Secondary | ICD-10-CM | POA: Diagnosis not present

## 2016-02-27 DIAGNOSIS — I10 Essential (primary) hypertension: Secondary | ICD-10-CM | POA: Diagnosis not present

## 2016-02-27 DIAGNOSIS — E1165 Type 2 diabetes mellitus with hyperglycemia: Secondary | ICD-10-CM | POA: Diagnosis not present

## 2016-02-28 DIAGNOSIS — J45909 Unspecified asthma, uncomplicated: Secondary | ICD-10-CM | POA: Diagnosis not present

## 2016-02-28 DIAGNOSIS — E1165 Type 2 diabetes mellitus with hyperglycemia: Secondary | ICD-10-CM | POA: Diagnosis not present

## 2016-02-28 DIAGNOSIS — I10 Essential (primary) hypertension: Secondary | ICD-10-CM | POA: Diagnosis not present

## 2016-03-13 DIAGNOSIS — I10 Essential (primary) hypertension: Secondary | ICD-10-CM | POA: Diagnosis not present

## 2016-03-13 DIAGNOSIS — J4540 Moderate persistent asthma, uncomplicated: Secondary | ICD-10-CM | POA: Diagnosis not present

## 2016-03-13 DIAGNOSIS — K219 Gastro-esophageal reflux disease without esophagitis: Secondary | ICD-10-CM | POA: Diagnosis not present

## 2016-03-13 DIAGNOSIS — E119 Type 2 diabetes mellitus without complications: Secondary | ICD-10-CM | POA: Diagnosis not present

## 2016-03-16 DIAGNOSIS — Z1231 Encounter for screening mammogram for malignant neoplasm of breast: Secondary | ICD-10-CM | POA: Diagnosis not present

## 2016-07-03 ENCOUNTER — Encounter: Payer: Self-pay | Admitting: *Deleted

## 2016-07-03 DIAGNOSIS — I10 Essential (primary) hypertension: Secondary | ICD-10-CM | POA: Diagnosis not present

## 2016-07-03 DIAGNOSIS — Z23 Encounter for immunization: Secondary | ICD-10-CM | POA: Diagnosis not present

## 2016-07-03 DIAGNOSIS — E119 Type 2 diabetes mellitus without complications: Secondary | ICD-10-CM | POA: Diagnosis not present

## 2016-07-03 DIAGNOSIS — J4540 Moderate persistent asthma, uncomplicated: Secondary | ICD-10-CM | POA: Diagnosis not present

## 2016-07-03 DIAGNOSIS — N939 Abnormal uterine and vaginal bleeding, unspecified: Secondary | ICD-10-CM | POA: Diagnosis not present

## 2016-07-16 ENCOUNTER — Ambulatory Visit (INDEPENDENT_AMBULATORY_CARE_PROVIDER_SITE_OTHER): Payer: Medicare Other | Admitting: Obstetrics and Gynecology

## 2016-07-16 ENCOUNTER — Encounter: Payer: Self-pay | Admitting: Obstetrics and Gynecology

## 2016-07-16 VITALS — BP 102/78 | HR 92 | Ht 67.0 in | Wt 157.4 lb

## 2016-07-16 DIAGNOSIS — N939 Abnormal uterine and vaginal bleeding, unspecified: Secondary | ICD-10-CM

## 2016-07-16 DIAGNOSIS — N938 Other specified abnormal uterine and vaginal bleeding: Secondary | ICD-10-CM

## 2016-07-16 NOTE — Progress Notes (Addendum)
Patient ID: Laura RivalStella K Corea, female   DOB: Jan 01, 1966, 50 y.o.   MRN: [id number]    [facility] Visit  @DATE @            Patient name: Laura Mullins MRN [id number]  Date of birth: Jan 01, 1966  CC & HPI:   Chief Complaint  Patient presents with  . abnormal vaginal bleeding     Laura Mullins is a 50 y.o. female presenting today for AUB. Pt was seen in this office 04/23/14 for irregular periods and treated with Provera. Per pt, she has been seeing a GYN, Gwenlyn PerkingMargie Trent, in [place] since that time and was given Depo for a period of time until her blood glucose became an issue and her bleeding increased substantially. Pt reports the depo did control her bleeding during the treatment. She states pelvic US was not done. Per record review, pt had endometrial biopsy 07/22/14 showing no sign of dysplasia, hyperplasia or any cancer. Pap and HPV were negative 07/22/14 (next due in 2020).   She states when her cycles come, they last 3-4 weeks with heavy bleeding. LMP began the last week of 05/2016 and lasted until 07/04/16. Prior menstrual period was in 03/2016, lasting 3 weeks. Pt had 2 children, 1 delivered vaginally and one delivered via cesarean.   ROS:  ROS +AUB  Pertinent History Reviewed:   Reviewed: Significant for DM, AUB Medical         Past Medical History:  Diagnosis Date  . Arthritis   . Asthma   . Diabetes mellitus without complication (HCC)   . GERD (gastroesophageal reflux disease)   . Hyperlipidemia   . Hypertension   . Peptic ulcer   . Premenopause menorrhagia                               Surgical Hx:    Past Surgical History:  Procedure Laterality Date  . BACK SURGERY    . CHOLECYSTECTOMY     Medications: Reviewed & Updated - see associated section                       Current Outpatient Prescriptions:  .  albuterol (PROVENTIL HFA;VENTOLIN HFA) 108 (90 BASE) MCG/ACT inhaler, Inhale 2 puffs into the lungs every 6 (six) hours as needed for wheezing or shortness of  breath., Disp: , Rfl:  .  albuterol (PROVENTIL) (2.5 MG/3ML) 0.083% nebulizer solution, Take 2.5 mg by nebulization every 6 (six) hours as needed for wheezing or shortness of breath., Disp: , Rfl:  .  aspirin EC 81 MG tablet, Take 81 mg by mouth daily., Disp: , Rfl:  .  gabapentin (NEURONTIN) 600 MG tablet, Take 600 mg by mouth daily as needed (for neuropathy/pain). , Disp: , Rfl:  .  insulin aspart protamine- aspart (NOVOLOG MIX 70/30) (70-30) 100 UNIT/ML injection, Inject 30 Units into the skin. 30-70 units/ml suspension, Disp: , Rfl:  .  lisinopril-hydrochlorothiazide (ZESTORETIC) 20-12.5 MG per tablet, Take 1 tablet by mouth daily., Disp: 90 tablet, Rfl: 3 .  loratadine (CLARITIN) 10 MG tablet, Take 10 mg by mouth daily as needed for allergies., Disp: , Rfl:  .  metFORMIN (GLUCOPHAGE) 1000 MG tablet, Take 1,000 mg by mouth 2 (two) times daily with a meal., Disp: , Rfl:  .  Multiple Vitamins-Calcium (ONE-A-DAY WOMENS PO), Take 1 tablet by mouth daily., Disp: , Rfl:  .  pravastatin (PRAVACHOL) 40 MG  tablet, Take 40 mg by mouth daily., Disp: , Rfl:  .  Aspirin-Salicylamide-Caffeine (BC HEADACHE) 325-95-16 MG TABS, Take 1 packet by mouth daily as needed (for pain)., Disp: , Rfl:  .  cephALEXin (KEFLEX) 500 MG capsule, Take 1 capsule (500 mg total) by mouth 4 (four) times daily. (Patient not taking: Reported on 07/16/2016), Disp: 28 capsule, Rfl: 0 .  ciprofloxacin (CIPRO) 500 MG tablet, Take 1 tablet (500 mg total) by mouth 2 (two) times daily. (Patient not taking: Reported on 07/16/2016), Disp: 14 tablet, Rfl: 0 .  glipiZIDE (GLUCOTROL) 10 MG tablet, Take 10 mg by mouth daily. , Disp: , Rfl:  .  HYDROcodone-acetaminophen (NORCO/VICODIN) 5-325 MG per tablet, Take 1-2 tablets by mouth every 6 (six) hours as needed. (Patient not taking: Reported on 07/16/2016), Disp: 10 tablet, Rfl: 0 .  medroxyPROGESTERone (DEPO-PROVERA) 150 MG/ML injection, Inject 150 mg into the muscle every 3 (three) months., Disp:  , Rfl:  .  medroxyPROGESTERone (PROVERA) 10 MG tablet, Take 1 tablet (10 mg total) by mouth daily. Take x 14 days each month. (Patient not taking: Reported on 07/16/2016), Disp: 42 tablet, Rfl: 0 .  metroNIDAZOLE (FLAGYL) 500 MG tablet, Take 1 tablet (500 mg total) by mouth 2 (two) times daily. One po bid x 7 days (Patient not taking: Reported on 07/16/2016), Disp: 14 tablet, Rfl: 0 .  prednisoLONE acetate (PRED FORTE) 1 % ophthalmic suspension, 1 drop 4 (four) times daily., Disp: , Rfl:    Social History: Reviewed -  reports that she has never smoked. She has never used smokeless tobacco.  Objective Findings:  Vitals: Blood pressure 102/78, pulse 92, height 5\' 7"  (1.702 m), weight 157 lb 6.4 oz (71.4 kg), last menstrual period 06/02/2016.  Physical Examination: General appearance - alert, well appearing, and in no distress Mental status - alert, oriented to person, place, and time Abdomen - soft, nontender, nondistended, no masses or organomegaly Pelvic -  VULVA: normal appearing vulva with no masses, tenderness or lesions,  VAGINA: normal appearing vagina with normal color and discharge, no lesions,  CERVIX: normal appearing cervix without discharge or lesions, normal in appearance  UTERUS: uterus is normal size, shape, consistency and nontender, small, well supported, anterior  ADNEXA: normal adnexa in size, nontender and no masses   Assessment & Plan:   A:  1. AUB   2. Pap and endometrial biopsy benign 07/22/14   P:  1. Will rx 10-14 days 5 mg Provera x 3 months (to be taken 1st of each month)  2. Pt to continue to keep a record of periods.  3 review menses in 3 months.   By signing my name below, I, Doreatha MartinEva Mathews, attest that this documentation has been prepared under the direction and in the presence of Tilda BurrowJohn V Mohammad Granade, MD. Electronically Signed: Doreatha MartinEva Mathews, ED Scribe. 07/16/16. 11:13 AM.  I personally performed the services described in this documentation, which was  SCRIBED in my presence. The recorded information has been reviewed and considered accurate. It has been edited as necessary during review. Tilda BurrowFERGUSON,Lyndsie Wallman V, MD

## 2016-07-19 ENCOUNTER — Telehealth: Payer: Self-pay | Admitting: Obstetrics and Gynecology

## 2016-07-19 NOTE — Telephone Encounter (Signed)
Pt states saw Dr. Emelda FearFerguson last week,  her Rx [facility] not [facility] [place].   Please prescribed.

## 2016-07-25 ENCOUNTER — Other Ambulatory Visit: Payer: Self-pay | Admitting: Obstetrics and Gynecology

## 2016-07-25 MED ORDER — MEDROXYPROGESTERONE ACETATE 10 MG PO TABS: 10.0000 mg | ORAL_TABLET | Freq: Every day | ORAL | 0 refills | Status: DC

## 2016-07-25 NOTE — Progress Notes (Signed)
Provera x 14 day/month escribed to [facility]

## 2016-10-17 ENCOUNTER — Encounter: Payer: Self-pay | Admitting: Obstetrics and Gynecology

## 2016-10-17 ENCOUNTER — Ambulatory Visit: Payer: Medicare Other | Admitting: Obstetrics and Gynecology

## 2017-06-10 DIAGNOSIS — Z23 Encounter for immunization: Secondary | ICD-10-CM | POA: Diagnosis not present

## 2017-06-10 DIAGNOSIS — K219 Gastro-esophageal reflux disease without esophagitis: Secondary | ICD-10-CM | POA: Diagnosis not present

## 2017-06-10 DIAGNOSIS — E1165 Type 2 diabetes mellitus with hyperglycemia: Secondary | ICD-10-CM | POA: Diagnosis not present

## 2017-06-10 DIAGNOSIS — I10 Essential (primary) hypertension: Secondary | ICD-10-CM | POA: Diagnosis not present

## 2017-06-10 DIAGNOSIS — E119 Type 2 diabetes mellitus without complications: Secondary | ICD-10-CM | POA: Diagnosis not present

## 2017-06-10 DIAGNOSIS — Z1389 Encounter for screening for other disorder: Secondary | ICD-10-CM | POA: Diagnosis not present

## 2017-06-10 DIAGNOSIS — J4540 Moderate persistent asthma, uncomplicated: Secondary | ICD-10-CM | POA: Diagnosis not present

## 2017-06-27 DIAGNOSIS — Z1231 Encounter for screening mammogram for malignant neoplasm of breast: Secondary | ICD-10-CM | POA: Diagnosis not present

## 2017-07-11 DIAGNOSIS — Z1211 Encounter for screening for malignant neoplasm of colon: Secondary | ICD-10-CM | POA: Diagnosis not present

## 2017-07-11 DIAGNOSIS — K219 Gastro-esophageal reflux disease without esophagitis: Secondary | ICD-10-CM | POA: Diagnosis not present

## 2017-07-11 DIAGNOSIS — Z8371 Family history of colonic polyps: Secondary | ICD-10-CM | POA: Diagnosis not present

## 2017-07-11 DIAGNOSIS — E119 Type 2 diabetes mellitus without complications: Secondary | ICD-10-CM | POA: Diagnosis not present

## 2017-07-11 DIAGNOSIS — Z800 Family history of malignant neoplasm of digestive organs: Secondary | ICD-10-CM | POA: Diagnosis not present

## 2017-07-11 DIAGNOSIS — G629 Polyneuropathy, unspecified: Secondary | ICD-10-CM | POA: Diagnosis not present

## 2017-09-12 DIAGNOSIS — E1165 Type 2 diabetes mellitus with hyperglycemia: Secondary | ICD-10-CM | POA: Diagnosis not present

## 2017-09-12 DIAGNOSIS — J4540 Moderate persistent asthma, uncomplicated: Secondary | ICD-10-CM | POA: Diagnosis not present

## 2017-09-12 DIAGNOSIS — I10 Essential (primary) hypertension: Secondary | ICD-10-CM | POA: Diagnosis not present

## 2017-09-12 DIAGNOSIS — K219 Gastro-esophageal reflux disease without esophagitis: Secondary | ICD-10-CM | POA: Diagnosis not present

## 2017-12-13 DIAGNOSIS — Z0000 Encounter for general adult medical examination without abnormal findings: Secondary | ICD-10-CM | POA: Diagnosis not present

## 2017-12-13 DIAGNOSIS — K219 Gastro-esophageal reflux disease without esophagitis: Secondary | ICD-10-CM | POA: Diagnosis not present

## 2017-12-13 DIAGNOSIS — J4540 Moderate persistent asthma, uncomplicated: Secondary | ICD-10-CM | POA: Diagnosis not present

## 2017-12-13 DIAGNOSIS — E1165 Type 2 diabetes mellitus with hyperglycemia: Secondary | ICD-10-CM | POA: Diagnosis not present

## 2017-12-13 DIAGNOSIS — I10 Essential (primary) hypertension: Secondary | ICD-10-CM | POA: Diagnosis not present

## 2017-12-13 DIAGNOSIS — E119 Type 2 diabetes mellitus without complications: Secondary | ICD-10-CM | POA: Diagnosis not present

## 2018-04-02 DIAGNOSIS — I10 Essential (primary) hypertension: Secondary | ICD-10-CM | POA: Diagnosis not present

## 2018-04-02 DIAGNOSIS — E119 Type 2 diabetes mellitus without complications: Secondary | ICD-10-CM | POA: Diagnosis not present

## 2018-04-02 DIAGNOSIS — R300 Dysuria: Secondary | ICD-10-CM | POA: Diagnosis not present

## 2018-04-02 DIAGNOSIS — E1165 Type 2 diabetes mellitus with hyperglycemia: Secondary | ICD-10-CM | POA: Diagnosis not present

## 2018-04-02 DIAGNOSIS — N3941 Urge incontinence: Secondary | ICD-10-CM | POA: Diagnosis not present

## 2018-05-29 DIAGNOSIS — Z23 Encounter for immunization: Secondary | ICD-10-CM | POA: Diagnosis not present

## 2018-05-29 DIAGNOSIS — K219 Gastro-esophageal reflux disease without esophagitis: Secondary | ICD-10-CM | POA: Diagnosis not present

## 2018-05-29 DIAGNOSIS — L03113 Cellulitis of right upper limb: Secondary | ICD-10-CM | POA: Diagnosis not present

## 2018-05-29 DIAGNOSIS — I10 Essential (primary) hypertension: Secondary | ICD-10-CM | POA: Diagnosis not present

## 2018-05-29 DIAGNOSIS — E119 Type 2 diabetes mellitus without complications: Secondary | ICD-10-CM | POA: Diagnosis not present

## 2018-07-01 DIAGNOSIS — Z0000 Encounter for general adult medical examination without abnormal findings: Secondary | ICD-10-CM | POA: Diagnosis not present

## 2018-07-01 DIAGNOSIS — Z1389 Encounter for screening for other disorder: Secondary | ICD-10-CM | POA: Diagnosis not present

## 2018-07-01 DIAGNOSIS — Z1331 Encounter for screening for depression: Secondary | ICD-10-CM | POA: Diagnosis not present

## 2018-07-01 DIAGNOSIS — R300 Dysuria: Secondary | ICD-10-CM | POA: Diagnosis not present

## 2018-07-01 DIAGNOSIS — I10 Essential (primary) hypertension: Secondary | ICD-10-CM | POA: Diagnosis not present

## 2018-07-01 DIAGNOSIS — E1142 Type 2 diabetes mellitus with diabetic polyneuropathy: Secondary | ICD-10-CM | POA: Diagnosis not present

## 2018-07-01 DIAGNOSIS — J4540 Moderate persistent asthma, uncomplicated: Secondary | ICD-10-CM | POA: Diagnosis not present

## 2018-07-01 DIAGNOSIS — K219 Gastro-esophageal reflux disease without esophagitis: Secondary | ICD-10-CM | POA: Diagnosis not present

## 2018-07-01 DIAGNOSIS — E119 Type 2 diabetes mellitus without complications: Secondary | ICD-10-CM | POA: Diagnosis not present

## 2018-07-15 DIAGNOSIS — E1165 Type 2 diabetes mellitus with hyperglycemia: Secondary | ICD-10-CM | POA: Diagnosis not present

## 2018-07-15 DIAGNOSIS — I10 Essential (primary) hypertension: Secondary | ICD-10-CM | POA: Diagnosis not present

## 2018-07-15 DIAGNOSIS — K219 Gastro-esophageal reflux disease without esophagitis: Secondary | ICD-10-CM | POA: Diagnosis not present

## 2018-07-15 DIAGNOSIS — E785 Hyperlipidemia, unspecified: Secondary | ICD-10-CM | POA: Diagnosis not present

## 2018-08-21 ENCOUNTER — Other Ambulatory Visit (HOSPITAL_COMMUNITY)
Admission: RE | Admit: 2018-08-21 | Discharge: 2018-08-21 | Disposition: A | Discharge status: Home / Self Care | Payer: Medicare Other | Source: Ambulatory Visit | Attending: Adult Health | Admitting: Adult Health

## 2018-08-21 ENCOUNTER — Encounter (INDEPENDENT_AMBULATORY_CARE_PROVIDER_SITE_OTHER): Payer: Self-pay

## 2018-08-21 ENCOUNTER — Ambulatory Visit (INDEPENDENT_AMBULATORY_CARE_PROVIDER_SITE_OTHER): Payer: Medicare Other | Admitting: Adult Health

## 2018-08-21 ENCOUNTER — Encounter: Payer: Self-pay | Admitting: Adult Health

## 2018-08-21 VITALS — BP 138/98 | HR 83 | Ht 66.0 in | Wt 174.5 lb

## 2018-08-21 DIAGNOSIS — Z1211 Encounter for screening for malignant neoplasm of colon: Secondary | ICD-10-CM

## 2018-08-21 DIAGNOSIS — K5900 Constipation, unspecified: Secondary | ICD-10-CM | POA: Diagnosis not present

## 2018-08-21 DIAGNOSIS — Z1212 Encounter for screening for malignant neoplasm of rectum: Secondary | ICD-10-CM

## 2018-08-21 DIAGNOSIS — Z01419 Encounter for gynecological examination (general) (routine) without abnormal findings: Secondary | ICD-10-CM | POA: Diagnosis not present

## 2018-08-21 DIAGNOSIS — R319 Hematuria, unspecified: Secondary | ICD-10-CM

## 2018-08-21 DIAGNOSIS — Z124 Encounter for screening for malignant neoplasm of cervix: Secondary | ICD-10-CM | POA: Diagnosis not present

## 2018-08-21 DIAGNOSIS — R350 Frequency of micturition: Secondary | ICD-10-CM

## 2018-08-21 DIAGNOSIS — N390 Urinary tract infection, site not specified: Secondary | ICD-10-CM | POA: Diagnosis not present

## 2018-08-21 LAB — POCT URINALYSIS DIPSTICK OB
Ketones, UA: NEGATIVE
Nitrite, UA: POSITIVE
POC,PROTEIN,UA: NEGATIVE

## 2018-08-21 LAB — HEMOCCULT GUIAC POC 1CARD (OFFICE): Fecal Occult Blood, POC: NEGATIVE

## 2018-08-21 MED ORDER — LINACLOTIDE 290 MCG PO CAPS: 290.0000 ug | ORAL_CAPSULE | Freq: Every day | ORAL | 0 refills | Status: DC

## 2018-08-21 MED ORDER — SULFAMETHOXAZOLE-TRIMETHOPRIM 800-160 MG PO TABS: 1.0000 | ORAL_TABLET | Freq: Two times a day (BID) | ORAL | 0 refills | Status: DC

## 2018-08-21 NOTE — Progress Notes (Signed)
Patient ID: Laura Mullins, female   DOB: [birth year], 52 y.o.   MRN: [id number] History of Present Illness: Laura Mullins is a 52 year old black female, married,PM, in for pap and pelvic exam, she has physical and labs with PCP. PCP is Dr Felecia ShellingFanta.  Current Medications, Allergies, Past Medical History, Past Surgical History, Family History and Social History were reviewed in [facility] electronic medical record.     Review of Systems: Patient denies any headaches, hearing loss, fatigue, blurred vision, shortness of breath, chest pain, abdominal pain, problems with intercourse. No joint pain or mood swings. +urinary frequency +conststipation, may go once a week She said last A1c 11, was 5.6 about  4 months ago, but husband home and is eating out a lot, will do better,she says   Physical Exam:BP (!) 138/98 (BP Location: Right Arm, Patient Position: Sitting, Cuff Size: Normal)   Pulse 83   Ht 5\' 6"  (1.676 m)   Wt 174 lb 8 oz (79.2 kg)   LMP 06/02/2016   BMI 28.17 kg/m   Urine dipstick is 4+glucose, trace blood, +nitrates and 2+ leuks. General:  Well developed, well nourished, no acute distress Skin:  Warm and dry Pelvic:  External genitalia is normal in appearance, no lesions.  The vagina is normal in appearance. Urethra has no lesions or masses. The cervix is smooth,pap with HPV performed. Uterus is felt to be normal size, shape, and contour.  No adnexal masses or tenderness noted.Bladder is non tender, no masses felt. Rectal: Good sphincter tone, no polyps, or hemorrhoids felt.  Hemoccult negative. Extremities/musculoskeletal:  No swelling or varicosities noted, no clubbing or cyanosis Psych:  No mood changes, alert and cooperative,seems happy PHQ 2 score 0. Fall risk is low. Examination chaperoned by Marchelle FolksAmanda Rash LPN. Will give her samples of linzess for constipation.  Impression: 1. Encounter for gynecological examination with Papanicolaou smear of cervix   2. Routine cervical smear    3. Urinary frequency   4. Constipation, unspecified constipation type   5. Urinary tract infection with hematuria, site unspecified   6. Screening for colorectal cancer       Plan: UA C&S sent Pap with HPV sent Meds ordered this encounter  Medications  . linaclotide (LINZESS) 290 MCG CAPS capsule    Sig: Take 1 capsule (290 mcg total) by mouth daily before breakfast.    Dispense:  28 capsule    Refill:  0    Order Specific Question:   Supervising Provider    Answer:   Despina HiddenEURE, LUTHER H [[id number]]  . sulfamethoxazole-trimethoprim (BACTRIM DS,SEPTRA DS) 800-160 MG tablet    Sig: Take 1 tablet by mouth 2 (two) times daily. Take 1 bid    Dispense:  14 tablet    Refill:  0    Order Specific Question:   Supervising Provider    Answer:   Duane LopeEURE, LUTHER H [[id number]]  Pap in 3 years if normal Physical and labs with Dr Felecia ShellingFanta Mammogram yearly Colonoscopy per GI

## 2018-08-22 LAB — URINALYSIS, ROUTINE W REFLEX MICROSCOPIC
BILIRUBIN UA: NEGATIVE
Ketones, UA: NEGATIVE
Nitrite, UA: NEGATIVE
Protein, UA: NEGATIVE
RBC UA: NEGATIVE
Specific Gravity, UA: 1.021 (ref 1.005–1.030)
UUROB: 1 mg/dL (ref 0.2–1.0)
pH, UA: 5 (ref 5.0–7.5)

## 2018-08-22 LAB — MICROSCOPIC EXAMINATION
Casts: NONE SEEN /lpf
RBC MICROSCOPIC, UA: NONE SEEN /HPF (ref 0–2)

## 2018-08-24 LAB — URINE CULTURE

## 2018-08-25 ENCOUNTER — Telehealth: Payer: Self-pay | Admitting: Adult Health

## 2018-08-25 LAB — CYTOLOGY - PAP
Diagnosis: NEGATIVE
HPV (WINDOPATH): NOT DETECTED

## 2018-08-25 NOTE — Telephone Encounter (Signed)
Pt aware urine culture +, finish septa ds should take care of it.

## 2018-09-01 ENCOUNTER — Telehealth: Payer: Self-pay | Admitting: Adult Health

## 2018-09-01 ENCOUNTER — Telehealth: Payer: Self-pay | Admitting: *Deleted

## 2018-09-01 MED ORDER — METRONIDAZOLE 500 MG PO TABS: ORAL_TABLET | ORAL | 0 refills | Status: DC

## 2018-09-01 NOTE — Telephone Encounter (Signed)
Left message to call me about pap, if she calls +trich, rx flagyl for pt and will need to treat partner, so need his name and DO and drug store and she will need POC about 10 days after finished taking meds

## 2018-09-01 NOTE — Telephone Encounter (Signed)
Pt informed of +trich, medication sent to pharmacy.  Wants husband treated. POC appt made.    Laura SocksWilliam Kroft, DOB [birth year], NKDA, [facility] [place]

## 2018-09-01 NOTE — Telephone Encounter (Signed)
Rx for Flagyl 500 mg #4 4 po [facility] [place] for Loa SocksWilliam Gradillas [birth year]

## 2018-09-18 ENCOUNTER — Ambulatory Visit (INDEPENDENT_AMBULATORY_CARE_PROVIDER_SITE_OTHER): Payer: Medicare Other | Admitting: Adult Health

## 2018-09-18 ENCOUNTER — Encounter: Payer: Self-pay | Admitting: Adult Health

## 2018-09-18 VITALS — BP 140/89 | HR 91 | Ht 67.0 in | Wt 175.0 lb

## 2018-09-18 DIAGNOSIS — B3731 Acute candidiasis of vulva and vagina: Secondary | ICD-10-CM

## 2018-09-18 DIAGNOSIS — Z09 Encounter for follow-up examination after completed treatment for conditions other than malignant neoplasm: Secondary | ICD-10-CM | POA: Diagnosis not present

## 2018-09-18 DIAGNOSIS — Z8619 Personal history of other infectious and parasitic diseases: Secondary | ICD-10-CM | POA: Diagnosis not present

## 2018-09-18 DIAGNOSIS — B373 Candidiasis of vulva and vagina: Secondary | ICD-10-CM

## 2018-09-18 LAB — POCT WET PREP (WET MOUNT)
CLUE CELLS WET PREP WHIFF POC: NEGATIVE
Trichomonas Wet Prep HPF POC: ABSENT
WBC, Wet Prep HPF POC: POSITIVE

## 2018-09-18 MED ORDER — FLUCONAZOLE 150 MG PO TABS: ORAL_TABLET | ORAL | 1 refills | Status: DC

## 2018-09-18 NOTE — Progress Notes (Signed)
Patient ID: NYLAA BULLEN, female   DOB: 1965/12/23, 53 y.o.   MRN: [id number] History of Present Illness: Rikki-Lee is a 53 year old black female, back for proof of cure for trich, has not had sex since treated. PCP is Dr Felecia Shelling.    Current Medications, Allergies, Past Medical History, Past Surgical History, Family History and Social History were reviewed in [employer] [facility] record.     Review of Systems: +vaginal itching Blood sugars ar high, around 600    Physical Exam:BP 140/89 (BP Location: Left Arm, Patient Position: Sitting, Cuff Size: Normal)   Pulse 91   Ht 5\' 7"  (1.702 m)   Wt 175 lb (79.4 kg)   LMP 06/02/2016   BMI 27.41 kg/m  General:  Well developed, well nourished, no acute distress Skin:  Warm and dry Pelvic:  External genitalia is normal in appearance, has white discharge at clitorus.  The vagina is normal in appearance.+white discharge without odor. Urethra has no lesions or masses. The cervix is bulbous.  Uterus is felt to be normal size, shape, and contour.  No adnexal masses or tenderness noted.Bladder is non tender, no masses felt, Wet prep: +yeast and WBC, no trich seen.Painted with venetian violet.Do not rub just pat dry.  Psych:  No mood changes, alert and cooperative,seems happy Examination chaperoned by Malachy Mood LPN.  Impression: 1. History of trichomoniasis   2. Yeast infection involving the vagina and surrounding area       Plan:  Meds ordered this encounter  Medications  . fluconazole (DIFLUCAN) 150 MG tablet    Sig: Take 1 now and 1 in 3 days    Dispense:  2 tablet    Refill:  1    Order Specific Question:   Supervising Provider    Answer:   Despina Hidden, LUTHER H [[id number]]  F/U in 2 weeks

## 2018-09-30 DIAGNOSIS — E785 Hyperlipidemia, unspecified: Secondary | ICD-10-CM | POA: Diagnosis not present

## 2018-09-30 DIAGNOSIS — J069 Acute upper respiratory infection, unspecified: Secondary | ICD-10-CM | POA: Diagnosis not present

## 2018-09-30 DIAGNOSIS — E1165 Type 2 diabetes mellitus with hyperglycemia: Secondary | ICD-10-CM | POA: Diagnosis not present

## 2018-09-30 DIAGNOSIS — I10 Essential (primary) hypertension: Secondary | ICD-10-CM | POA: Diagnosis not present

## 2018-10-02 ENCOUNTER — Encounter: Payer: Self-pay | Admitting: Adult Health

## 2018-10-02 ENCOUNTER — Ambulatory Visit (INDEPENDENT_AMBULATORY_CARE_PROVIDER_SITE_OTHER): Payer: Medicare Other | Admitting: Adult Health

## 2018-10-02 VITALS — BP 132/85 | HR 102 | Ht 67.0 in | Wt 171.0 lb

## 2018-10-02 DIAGNOSIS — B373 Candidiasis of vulva and vagina: Secondary | ICD-10-CM

## 2018-10-02 DIAGNOSIS — B3731 Acute candidiasis of vulva and vagina: Secondary | ICD-10-CM

## 2018-10-02 NOTE — Progress Notes (Signed)
Patient ID: Laura Mullins, female   DOB: 1966-02-16, 53 y.o.   MRN: [id number] History of Present Illness: Laura Mullins is a 53 year old black female, married back in follow up on bing treated for yeast and feels much better, no itching or burning. PCP is Dr Felecia Shelling.   Current Medications, Allergies, Past Medical History, Past Surgical History, Family History and Social History were reviewed in [employer] [facility] record.     Review of Systems:  No itching or burning   Physical Exam:BP 132/85 (BP Location: Left Arm, Patient Position: Sitting, Cuff Size: Normal)   Pulse (!) 102   Ht 5\' 7"  (1.702 m)   Wt 171 lb (77.6 kg)   LMP 06/02/2016   BMI 26.78 kg/m  General:  Well developed, well nourished, no acute distress Skin:  Warm and dry Pelvic:  External genitalia is normal in appearance, no lesions.Yeast has resolved.  The vagina is normal in appearance. Urethra has no lesions or masses. The cervix is bulbous.  Uterus is felt to be normal size, shape, and contour.  No adnexal masses or tenderness noted.Bladder is non tender, no masses felt. Psych:  No mood changes, alert and cooperative,seems happy   Impression: 1. Yeast infection involving the vagina and surrounding area   Resolved.      Plan: F/U prn

## 2018-12-30 DIAGNOSIS — I10 Essential (primary) hypertension: Secondary | ICD-10-CM | POA: Diagnosis not present

## 2018-12-30 DIAGNOSIS — E1165 Type 2 diabetes mellitus with hyperglycemia: Secondary | ICD-10-CM | POA: Diagnosis not present

## 2018-12-30 DIAGNOSIS — K219 Gastro-esophageal reflux disease without esophagitis: Secondary | ICD-10-CM | POA: Diagnosis not present

## 2019-04-07 DIAGNOSIS — E1165 Type 2 diabetes mellitus with hyperglycemia: Secondary | ICD-10-CM | POA: Diagnosis not present

## 2019-04-07 DIAGNOSIS — I10 Essential (primary) hypertension: Secondary | ICD-10-CM | POA: Diagnosis not present

## 2019-04-07 DIAGNOSIS — J4540 Moderate persistent asthma, uncomplicated: Secondary | ICD-10-CM | POA: Diagnosis not present

## 2019-04-07 DIAGNOSIS — K219 Gastro-esophageal reflux disease without esophagitis: Secondary | ICD-10-CM | POA: Diagnosis not present

## 2019-04-20 ENCOUNTER — Ambulatory Visit
Admission: EM | Admit: 2019-04-20 | Discharge: 2019-04-20 | Disposition: A | Discharge status: Home / Self Care | Payer: Medicare Other | Attending: Emergency Medicine | Admitting: Emergency Medicine

## 2019-04-20 ENCOUNTER — Other Ambulatory Visit: Payer: Self-pay

## 2019-04-20 DIAGNOSIS — K047 Periapical abscess without sinus: Secondary | ICD-10-CM | POA: Diagnosis not present

## 2019-04-20 DIAGNOSIS — K0889 Other specified disorders of teeth and supporting structures: Secondary | ICD-10-CM

## 2019-04-20 DIAGNOSIS — B354 Tinea corporis: Secondary | ICD-10-CM

## 2019-04-20 MED ORDER — AMOXICILLIN-POT CLAVULANATE 875-125 MG PO TABS: 1.0000 | ORAL_TABLET | Freq: Two times a day (BID) | ORAL | 0 refills | Status: AC

## 2019-04-20 MED ORDER — HYDROCODONE-ACETAMINOPHEN 5-325 MG PO TABS: 1.0000 | ORAL_TABLET | Freq: Two times a day (BID) | ORAL | 0 refills | Status: DC | PRN

## 2019-04-20 MED ORDER — CICLOPIROX OLAMINE 0.77 % EX CREA: TOPICAL_CREAM | Freq: Two times a day (BID) | CUTANEOUS | 1 refills | Status: DC

## 2019-04-20 MED ORDER — MELOXICAM 7.5 MG PO TABS: 7.5000 mg | ORAL_TABLET | Freq: Every day | ORAL | 0 refills | Status: DC

## 2019-04-20 NOTE — ED Triage Notes (Signed)
Pt has broken tooth in bottom left jaw

## 2019-04-20 NOTE — Discharge Instructions (Signed)
Mobic prescribed.  Take as directed for pain Norco prescribed for severe break-through pain.  DO NOT TAKE prior to driving or operating heavy machinery as this medication may make you drowsy Augmentin prescribed for possible infection.  Take as directed and to completion to prevent infection Recommend soft diet until evaluated by dentist Maintain oral hygiene care Follow up with dentist as soon as possible for further evaluation and treatment  Return or go to the ED if you have any new or worsening symptoms such as fever, chills, difficulty swallowing, painful swallowing, oral or neck swelling, nausea, vomiting, chest pain, SOB, etc...  Wash with warm water and mild soap Ciclopirox cream prescribed.  Use as instructed for possible yeast infection under breast Follow up with PCP next week for recheck and to ensure your symptoms are improving Return or go to the ED if you have any new or worsening symptoms such as fever, chills, redness, swelling, discharge, worsening rash despite medication, etc..Marland Kitchen

## 2019-04-20 NOTE — ED Provider Notes (Signed)
[facility]   [id number] 04/20/19 Arrival Time: 1457  CC: DENTAL PAIN and rash under breast  SUBJECTIVE:  Laura Mullins is a 53 y.o. female who presents with left lower dental pain that began 3 days ago after eating nuts.  Localizes pain to left lower side.  Has tried OTC analgesics without relief.  Worse with chewing.  Has dentist appt next week.  Denies similar symptoms in the past.  Denies fever, chills, dysphagia, odynophagia, oral or neck swelling, nausea, vomiting, chest pain, SOB.    Patient also mentions itchy rash under bilateral breast x 1 month.  Has tried OTC medications without relief.  Denies similar symptoms in the past.  Has DM, not well-controlled.  Currently on insulin and metformin.  Blood glucose in the 300's. Has appt with PCP next week for DM check.  Denies redness, swelling, discharge, pain.    ROS: As per HPI.  All other pertinent ROS negative.     Past Medical History:  Diagnosis Date  . Arthritis   . Asthma   . Diabetes mellitus without complication (HCC)   . GERD (gastroesophageal reflux disease)   . Hyperlipidemia   . Hypertension   . Peptic ulcer   . Premenopause menorrhagia   . Trichimoniasis    Past Surgical History:  Procedure Laterality Date  . BACK SURGERY    . CHOLECYSTECTOMY     No Known Allergies No current facility-administered medications on file prior to encounter.    Current Outpatient Medications on File Prior to Encounter  Medication Sig Dispense Refill  . albuterol (PROVENTIL HFA;VENTOLIN HFA) 108 (90 BASE) MCG/ACT inhaler Inhale 2 puffs into the lungs every 6 (six) hours as needed for wheezing or shortness of breath.    Marland Kitchen. albuterol (PROVENTIL) (2.5 MG/3ML) 0.083% nebulizer solution Take 2.5 mg by nebulization every 6 (six) hours as needed for wheezing or shortness of breath.    Marland Kitchen. aspirin EC 81 MG tablet Take 81 mg by mouth daily.    Marland Kitchen. gabapentin (NEURONTIN) 600 MG tablet Take 600 mg by mouth daily as needed (for  neuropathy/pain).     . insulin aspart protamine- aspart (NOVOLOG MIX 70/30) (70-30) 100 UNIT/ML injection Inject 40 Units into the skin 2 (two) times daily with a meal. 30-70 units/ml suspension     . linaclotide (LINZESS) 290 MCG CAPS capsule Take 1 capsule (290 mcg total) by mouth daily before breakfast. (Patient taking differently: Take 290 mcg by mouth as needed. ) 28 capsule 0  . lisinopril-hydrochlorothiazide (ZESTORETIC) 20-12.5 MG per tablet Take 1 tablet by mouth daily. 90 tablet 3  . loratadine (CLARITIN) 10 MG tablet Take 10 mg by mouth daily as needed for allergies.    . metFORMIN (GLUCOPHAGE) 1000 MG tablet Take 1,000 mg by mouth 2 (two) times daily with a meal.    . Multiple Vitamins-Calcium (ONE-A-DAY WOMENS PO) Take 1 tablet by mouth. 3 times a week    . pravastatin (PRAVACHOL) 40 MG tablet Take 40 mg by mouth daily.     Social History   Socioeconomic History  . Marital status: Married    Spouse name: Not on file  . Number of children: Not on file  . Years of education: Not on file  . Highest education level: Not on file  Occupational History  . Not on file  Social Needs  . Financial resource strain: Not on file  . Food insecurity    Worry: Not on file    Inability: Not on file  .  Transportation needs    Medical: Not on file    Non-medical: Not on file  Tobacco Use  . Smoking status: Never Smoker  . Smokeless tobacco: Never Used  Substance and Sexual Activity  . Alcohol use: No  . Drug use: No  . Sexual activity: Not Currently    Birth control/protection: Post-menopausal  Lifestyle  . Physical activity    Days per week: Not on file    Minutes per session: Not on file  . Stress: Not on file  Relationships  . Social Herbalist on phone: Not on file    Gets together: Not on file    Attends religious service: Not on file    Active member of club or organization: Not on file    Attends meetings of clubs or organizations: Not on file    Relationship  status: Not on file  . Intimate partner violence    Fear of current or ex partner: Not on file    Emotionally abused: Not on file    Physically abused: Not on file    Forced sexual activity: Not on file  Other Topics Concern  . Not on file  Social History Narrative  . Not on file   Family History  Problem Relation Age of Onset  . Hypertension Mother   . Hyperlipidemia Mother   . Diabetes Mother   . Stroke Father   . Heart disease Father   . Heart attack Father   . Alcohol abuse Father   . Cancer Sister        leukemia  . Diabetes Sister   . Hypertension Sister   . Heart disease Sister   . Alcohol abuse Sister   . Schizophrenia Sister   . Cancer Brother   . Diabetes Brother   . Diabetes Maternal Grandmother   . Cancer Maternal Grandmother   . Alzheimer's disease Maternal Grandmother   . Cancer Sister   . Diabetes Sister   . Cancer Sister   . Diabetes Sister   . Diabetes Sister   . Diabetes Sister   . Diabetes Sister   . Diabetes Sister   . Cancer Brother   . Diabetes Brother   . Diabetes Brother   . Diabetes Brother   . Diabetes Brother   . Heart disease Brother   . Diabetes Brother   . Cancer Brother        colon  . Diabetes Brother   . Hyperlipidemia Brother   . Diabetes Brother   . Diabetes Brother     OBJECTIVE:  Vitals:   04/20/19 1505  BP: 134/86  Pulse: 80  Resp: 20  Temp: 98.2 F (36.8 C)  SpO2: 97%    General appearance: alert; no distress HENT: normocephalic; atraumatic; dentition: fair; cavity back molar over left lower gums without areas of fluctuance; no crepitus underneath tongue Neck: supple without LAD Lungs: normal respirations; CTAB CV: RRR Chest wall: diffuse hyperpigmented macular rash in sparse distribution under bilateral breast, NTTP, no obvious drainage, erythema, or bleeding Skin: warm and dry Psychological: alert and cooperative; normal mood and affect  ASSESSMENT & PLAN:  1. Tooth infection   2. Pain, dental   3.  Tinea corporis     Meds ordered this encounter  Medications  . meloxicam (MOBIC) 7.5 MG tablet    Sig: Take 1 tablet (7.5 mg total) by mouth daily.    Dispense:  20 tablet    Refill:  0    Order  Specific Question:   Supervising Provider    Answer:   Eustace MooreNELSON, YVONNE SUE [[id number]]  . HYDROcodone-acetaminophen (NORCO/VICODIN) 5-325 MG tablet    Sig: Take 1 tablet by mouth every 12 (twelve) hours as needed for severe pain.    Dispense:  10 tablet    Refill:  0    Order Specific Question:   Supervising Provider    Answer:   Eustace MooreNELSON, YVONNE SUE [[id number]][[id number]]  . amoxicillin-clavulanate (AUGMENTIN) 875-125 MG tablet    Sig: Take 1 tablet by mouth every 12 (twelve) hours for 10 days.    Dispense:  20 tablet    Refill:  0    Order Specific Question:   Supervising Provider    Answer:   Eustace MooreNELSON, YVONNE SUE [[id number]]  . ciclopirox (LOPROX) 0.77 % cream    Sig: Apply topically 2 (two) times daily.    Dispense:  30 g    Refill:  1    Order Specific Question:   Supervising Provider    Answer:   Eustace MooreELSON, YVONNE SUE [[id number]]    Mobic prescribed.  Take as directed for pain Norco prescribed for severe break-through pain.  DO NOT TAKE prior to driving or operating heavy machinery as this medication may make you drowsy Augmentin prescribed for possible infection.  Take as directed and to completion to prevent infection Recommend soft diet until evaluated by dentist Maintain oral hygiene care Follow up with dentist as soon as possible for further evaluation and treatment  Return or go to the ED if you have any new or worsening symptoms such as fever, chills, difficulty swallowing, painful swallowing, oral or neck swelling, nausea, vomiting, chest pain, SOB, etc...  Wash with warm water and mild soap Ciclopirox cream prescribed.  Use as instructed for possible yeast infection under breast Follow up with PCP next week for recheck and to ensure your symptoms are improving Return or go to the ED if you have  any new or worsening symptoms such as fever, chills, redness, swelling, discharge, worsening rash despite medication, etc...  Reviewed expectations re: course of current medical issues. Questions answered. Outlined signs and symptoms indicating need for more acute intervention. Patient verbalized understanding. After Visit Summary given.   Rennis HardingWurst, Mekhai Venuto, PA-C 04/20/19 1548

## 2019-04-30 ENCOUNTER — Other Ambulatory Visit (HOSPITAL_COMMUNITY): Payer: Self-pay | Admitting: Internal Medicine

## 2019-04-30 DIAGNOSIS — Z1231 Encounter for screening mammogram for malignant neoplasm of breast: Secondary | ICD-10-CM

## 2019-05-20 ENCOUNTER — Ambulatory Visit (HOSPITAL_COMMUNITY)
Admission: RE | Admit: 2019-05-20 | Discharge: 2019-05-20 | Disposition: A | Discharge status: Home / Self Care | Payer: Medicare Other | Source: Ambulatory Visit | Attending: Internal Medicine | Admitting: Internal Medicine

## 2019-05-20 ENCOUNTER — Other Ambulatory Visit: Payer: Self-pay

## 2019-05-20 DIAGNOSIS — Z1231 Encounter for screening mammogram for malignant neoplasm of breast: Secondary | ICD-10-CM

## 2019-06-09 DIAGNOSIS — Z1389 Encounter for screening for other disorder: Secondary | ICD-10-CM | POA: Diagnosis not present

## 2019-06-09 DIAGNOSIS — J4540 Moderate persistent asthma, uncomplicated: Secondary | ICD-10-CM | POA: Diagnosis not present

## 2019-06-09 DIAGNOSIS — K219 Gastro-esophageal reflux disease without esophagitis: Secondary | ICD-10-CM | POA: Diagnosis not present

## 2019-06-09 DIAGNOSIS — E1165 Type 2 diabetes mellitus with hyperglycemia: Secondary | ICD-10-CM | POA: Diagnosis not present

## 2019-06-09 DIAGNOSIS — I10 Essential (primary) hypertension: Secondary | ICD-10-CM | POA: Diagnosis not present

## 2019-06-09 DIAGNOSIS — Z1331 Encounter for screening for depression: Secondary | ICD-10-CM | POA: Diagnosis not present

## 2019-06-09 DIAGNOSIS — Z23 Encounter for immunization: Secondary | ICD-10-CM | POA: Diagnosis not present

## 2019-06-15 DIAGNOSIS — Z0001 Encounter for general adult medical examination with abnormal findings: Secondary | ICD-10-CM | POA: Diagnosis not present

## 2019-06-15 DIAGNOSIS — I10 Essential (primary) hypertension: Secondary | ICD-10-CM | POA: Diagnosis not present

## 2019-06-15 DIAGNOSIS — E1142 Type 2 diabetes mellitus with diabetic polyneuropathy: Secondary | ICD-10-CM | POA: Diagnosis not present

## 2019-10-08 DIAGNOSIS — K219 Gastro-esophageal reflux disease without esophagitis: Secondary | ICD-10-CM | POA: Diagnosis not present

## 2019-10-08 DIAGNOSIS — I10 Essential (primary) hypertension: Secondary | ICD-10-CM | POA: Diagnosis not present

## 2019-10-08 DIAGNOSIS — E1142 Type 2 diabetes mellitus with diabetic polyneuropathy: Secondary | ICD-10-CM | POA: Diagnosis not present

## 2019-10-08 DIAGNOSIS — J4540 Moderate persistent asthma, uncomplicated: Secondary | ICD-10-CM | POA: Diagnosis not present

## 2019-11-26 DIAGNOSIS — I10 Essential (primary) hypertension: Secondary | ICD-10-CM | POA: Diagnosis not present

## 2019-11-26 DIAGNOSIS — E1165 Type 2 diabetes mellitus with hyperglycemia: Secondary | ICD-10-CM | POA: Diagnosis not present

## 2019-11-26 LAB — HEMOGLOBIN A1C: Hemoglobin A1C: 8.3

## 2019-11-26 LAB — COMPREHENSIVE METABOLIC PANEL
GFR calc Af Amer: >60
GFR calc non Af Amer: >60

## 2019-11-26 LAB — BASIC METABOLIC PANEL
BUN: 7 (ref 4–21)
Creatinine: 0.6 (ref 0.5–1.1)

## 2019-11-30 DIAGNOSIS — Z23 Encounter for immunization: Secondary | ICD-10-CM | POA: Diagnosis not present

## 2019-12-28 DIAGNOSIS — Z23 Encounter for immunization: Secondary | ICD-10-CM | POA: Diagnosis not present

## 2020-01-07 DIAGNOSIS — M1990 Unspecified osteoarthritis, unspecified site: Secondary | ICD-10-CM | POA: Diagnosis not present

## 2020-01-07 DIAGNOSIS — Z91018 Allergy to other foods: Secondary | ICD-10-CM | POA: Diagnosis not present

## 2020-01-07 DIAGNOSIS — Z7982 Long term (current) use of aspirin: Secondary | ICD-10-CM | POA: Diagnosis not present

## 2020-01-07 DIAGNOSIS — Z79899 Other long term (current) drug therapy: Secondary | ICD-10-CM | POA: Diagnosis not present

## 2020-01-07 DIAGNOSIS — I10 Essential (primary) hypertension: Secondary | ICD-10-CM | POA: Diagnosis not present

## 2020-01-07 DIAGNOSIS — K219 Gastro-esophageal reflux disease without esophagitis: Secondary | ICD-10-CM | POA: Diagnosis not present

## 2020-01-07 DIAGNOSIS — Z825 Family history of asthma and other chronic lower respiratory diseases: Secondary | ICD-10-CM | POA: Diagnosis not present

## 2020-01-07 DIAGNOSIS — E1140 Type 2 diabetes mellitus with diabetic neuropathy, unspecified: Secondary | ICD-10-CM | POA: Diagnosis not present

## 2020-01-07 DIAGNOSIS — E1165 Type 2 diabetes mellitus with hyperglycemia: Secondary | ICD-10-CM | POA: Diagnosis not present

## 2020-01-07 DIAGNOSIS — E785 Hyperlipidemia, unspecified: Secondary | ICD-10-CM | POA: Diagnosis not present

## 2020-01-07 DIAGNOSIS — Z794 Long term (current) use of insulin: Secondary | ICD-10-CM | POA: Diagnosis not present

## 2020-01-07 DIAGNOSIS — Z9851 Tubal ligation status: Secondary | ICD-10-CM | POA: Diagnosis not present

## 2020-01-07 DIAGNOSIS — E7800 Pure hypercholesterolemia, unspecified: Secondary | ICD-10-CM | POA: Diagnosis not present

## 2020-01-07 DIAGNOSIS — J45909 Unspecified asthma, uncomplicated: Secondary | ICD-10-CM | POA: Diagnosis not present

## 2020-03-24 DIAGNOSIS — K219 Gastro-esophageal reflux disease without esophagitis: Secondary | ICD-10-CM | POA: Diagnosis not present

## 2020-03-24 DIAGNOSIS — E1142 Type 2 diabetes mellitus with diabetic polyneuropathy: Secondary | ICD-10-CM | POA: Diagnosis not present

## 2020-03-24 DIAGNOSIS — J4540 Moderate persistent asthma, uncomplicated: Secondary | ICD-10-CM | POA: Diagnosis not present

## 2020-03-24 DIAGNOSIS — I10 Essential (primary) hypertension: Secondary | ICD-10-CM | POA: Diagnosis not present

## 2020-04-01 DIAGNOSIS — E1165 Type 2 diabetes mellitus with hyperglycemia: Secondary | ICD-10-CM | POA: Diagnosis not present

## 2020-04-01 DIAGNOSIS — E1142 Type 2 diabetes mellitus with diabetic polyneuropathy: Secondary | ICD-10-CM | POA: Diagnosis not present

## 2020-04-01 DIAGNOSIS — Z0001 Encounter for general adult medical examination with abnormal findings: Secondary | ICD-10-CM | POA: Diagnosis not present

## 2020-04-01 DIAGNOSIS — I10 Essential (primary) hypertension: Secondary | ICD-10-CM | POA: Diagnosis not present

## 2020-04-01 DIAGNOSIS — J4540 Moderate persistent asthma, uncomplicated: Secondary | ICD-10-CM | POA: Diagnosis not present

## 2020-04-01 DIAGNOSIS — I110 Hypertensive heart disease with heart failure: Secondary | ICD-10-CM | POA: Diagnosis not present

## 2020-05-10 ENCOUNTER — Other Ambulatory Visit: Payer: Self-pay

## 2020-05-10 ENCOUNTER — Ambulatory Visit (INDEPENDENT_AMBULATORY_CARE_PROVIDER_SITE_OTHER): Payer: Medicare Other | Admitting: "Endocrinology

## 2020-05-10 ENCOUNTER — Encounter: Payer: Self-pay | Admitting: "Endocrinology

## 2020-05-10 VITALS — BP 116/80 | HR 92 | Ht 67.0 in | Wt 164.2 lb

## 2020-05-10 DIAGNOSIS — Z794 Long term (current) use of insulin: Secondary | ICD-10-CM

## 2020-05-10 DIAGNOSIS — E1165 Type 2 diabetes mellitus with hyperglycemia: Secondary | ICD-10-CM | POA: Diagnosis not present

## 2020-05-10 DIAGNOSIS — E782 Mixed hyperlipidemia: Secondary | ICD-10-CM

## 2020-05-10 LAB — POCT GLYCOSYLATED HEMOGLOBIN (HGB A1C): Hemoglobin A1C: 11.2 % — AB (ref 4.0–5.6)

## 2020-05-10 MED ORDER — GLIPIZIDE ER 5 MG PO TB24: 5.0000 mg | ORAL_TABLET | Freq: Every day | ORAL | 3 refills | Status: DC

## 2020-05-10 NOTE — Progress Notes (Signed)
Endocrinology Consult Note       05/10/2020, 1:11 PM   Subjective:    Patient ID: Laura Mullins, female    DOB: 1965/10/17.  Laura Mullins is being seen in consultation for management of currently uncontrolled symptomatic diabetes requested by  Avon Gully, MD.   Past Medical History:  Diagnosis Date  . Arthritis   . Asthma   . Diabetes mellitus without complication (HCC)   . GERD (gastroesophageal reflux disease)   . Hyperlipidemia   . Hypertension   . Peptic ulcer   . Premenopause menorrhagia   . Trichimoniasis     Past Surgical History:  Procedure Laterality Date  . BACK SURGERY    . CHOLECYSTECTOMY      Social History   Socioeconomic History  . Marital status: Married    Spouse name: Not on file  . Number of children: Not on file  . Years of education: Not on file  . Highest education level: Not on file  Occupational History  . Not on file  Tobacco Use  . Smoking status: Never Smoker  . Smokeless tobacco: Never Used  Vaping Use  . Vaping Use: Never used  Substance and Sexual Activity  . Alcohol use: No  . Drug use: No  . Sexual activity: Not Currently    Birth control/protection: Post-menopausal  Other Topics Concern  . Not on file  Social History Narrative  . Not on file   Social Determinants of Health   Financial Resource Strain:   . Difficulty of Paying Living Expenses: Not on file  Food Insecurity:   . Worried About Programme researcher, broadcasting/film/video in the Last Year: Not on file  . Ran Out of Food in the Last Year: Not on file  Transportation Needs:   . Lack of Transportation (Medical): Not on file  . Lack of Transportation (Non-Medical): Not on file  Physical Activity:   . Days of Exercise per Week: Not on file  . Minutes of Exercise per Session: Not on file  Stress:   . Feeling of Stress : Not on file  Social Connections:   . Frequency of Communication with Friends  and Family: Not on file  . Frequency of Social Gatherings with Friends and Family: Not on file  . Attends Religious Services: Not on file  . Active Member of Clubs or Organizations: Not on file  . Attends Banker Meetings: Not on file  . Marital Status: Not on file    Family History  Problem Relation Age of Onset  . Hypertension Mother   . Hyperlipidemia Mother   . Diabetes Mother   . Stroke Father   . Heart disease Father   . Heart attack Father   . Alcohol abuse Father   . Cancer Sister        leukemia  . Diabetes Sister   . Hypertension Sister   . Heart disease Sister   . Alcohol abuse Sister   . Schizophrenia Sister   . Cancer Brother   . Diabetes Brother   . Diabetes Maternal Grandmother   . Cancer Maternal Grandmother   .  Alzheimer's disease Maternal Grandmother   . Cancer Sister   . Diabetes Sister   . Cancer Sister   . Diabetes Sister   . Diabetes Sister   . Diabetes Sister   . Diabetes Sister   . Diabetes Sister   . Cancer Brother   . Diabetes Brother   . Diabetes Brother   . Diabetes Brother   . Diabetes Brother   . Heart disease Brother   . Diabetes Brother   . Cancer Brother        colon  . Diabetes Brother   . Hyperlipidemia Brother   . Diabetes Brother   . Diabetes Brother     Outpatient Encounter Medications as of 05/10/2020  Medication Sig  . atorvastatin (LIPITOR) 40 MG tablet Take 40 mg by mouth daily.  Marland Kitchen. omeprazole (PRILOSEC) 40 MG capsule Take 40 mg by mouth daily.  Marland Kitchen. albuterol (PROVENTIL HFA;VENTOLIN HFA) 108 (90 BASE) MCG/ACT inhaler Inhale 2 puffs into the lungs every 6 (six) hours as needed for wheezing or shortness of breath.  Marland Kitchen. albuterol (PROVENTIL) (2.5 MG/3ML) 0.083% nebulizer solution Take 2.5 mg by nebulization every 6 (six) hours as needed for wheezing or shortness of breath.  Marland Kitchen. aspirin EC 81 MG tablet Take 81 mg by mouth daily.  Marland Kitchen. gabapentin (NEURONTIN) 600 MG tablet Take 600 mg by mouth daily as needed (for  neuropathy/pain).   Marland Kitchen. glipiZIDE (GLUCOTROL XL) 5 MG 24 hr tablet Take 1 tablet (5 mg total) by mouth daily with breakfast.  . insulin aspart protamine- aspart (NOVOLOG MIX 70/30) (70-30) 100 UNIT/ML injection Inject 50 Units into the skin 2 (two) times daily with a meal.  . linaclotide (LINZESS) 290 MCG CAPS capsule Take 1 capsule (290 mcg total) by mouth daily before breakfast. (Patient taking differently: Take 290 mcg by mouth as needed. )  . lisinopril-hydrochlorothiazide (ZESTORETIC) 20-12.5 MG per tablet Take 1 tablet by mouth daily.  Marland Kitchen. loratadine (CLARITIN) 10 MG tablet Take 10 mg by mouth daily as needed for allergies.  . metFORMIN (GLUCOPHAGE) 1000 MG tablet Take 1,000 mg by mouth 2 (two) times daily with a meal.  . Multiple Vitamins-Calcium (ONE-A-DAY WOMENS PO) Take 1 tablet by mouth. 3 times a week  . [DISCONTINUED] ciclopirox (LOPROX) 0.77 % cream Apply topically 2 (two) times daily.  . [DISCONTINUED] HYDROcodone-acetaminophen (NORCO/VICODIN) 5-325 MG tablet Take 1 tablet by mouth every 12 (twelve) hours as needed for severe pain.  . [DISCONTINUED] meloxicam (MOBIC) 7.5 MG tablet Take 1 tablet (7.5 mg total) by mouth daily.  . [DISCONTINUED] pravastatin (PRAVACHOL) 40 MG tablet Take 40 mg by mouth daily.   No facility-administered encounter medications on file as of 05/10/2020.    ALLERGIES: No Known Allergies  VACCINATION STATUS: Immunization History  Administered Date(s) Administered  . Influenza-Unspecified 09/03/2018  . Moderna SARS-COVID-2 Vaccination 11/30/2019, 12/28/2019    Diabetes She presents for her initial diabetic visit. She has type 2 diabetes mellitus. Onset time: She was diagnosed at approximate age of 40 years. Her disease course has been worsening. There are no hypoglycemic associated symptoms. Pertinent negatives for hypoglycemia include no confusion, headaches, pallor or seizures. Associated symptoms include fatigue, polydipsia and polyuria. Pertinent  negatives for diabetes include no chest pain and no polyphagia. There are no hypoglycemic complications. Symptoms are worsening. There are no diabetic complications. Risk factors for coronary artery disease include diabetes mellitus, dyslipidemia, hypertension and family history. Current diabetic treatments: She is currently on NovoLog 70/30 45 units twice daily, as well as Metformin  1000 g p.o. twice daily. Her weight is fluctuating minimally. She is following a generally unhealthy diet. When asked about meal planning, she reported none. She has not had a previous visit with a dietitian. Her home blood glucose trend is increasing steadily. (She did not bring any logs nor meter with her today.  Her point-of-care A1c is 11.2%, increasing from 8.3% in June 2021.) An ACE inhibitor/angiotensin II receptor blocker is being taken.  Hyperlipidemia This is a chronic problem. The current episode started more than 1 year ago. The problem is uncontrolled. Exacerbating diseases include diabetes. Pertinent negatives include no chest pain, myalgias or shortness of breath. Current antihyperlipidemic treatment includes statins. Risk factors for coronary artery disease include dyslipidemia, diabetes mellitus, hypertension, a sedentary lifestyle and family history.  Hypertension This is a chronic problem. The problem is controlled. Pertinent negatives include no chest pain, headaches, palpitations or shortness of breath. Risk factors for coronary artery disease include dyslipidemia, diabetes mellitus and family history. Past treatments include ACE inhibitors and diuretics.     Review of Systems  Constitutional: Positive for fatigue. Negative for chills, fever and unexpected weight change.  HENT: Negative for trouble swallowing and voice change.   Eyes: Negative for visual disturbance.  Respiratory: Negative for cough, shortness of breath and wheezing.   Cardiovascular: Negative for chest pain, palpitations and leg  swelling.  Gastrointestinal: Negative for diarrhea, nausea and vomiting.  Endocrine: Positive for polydipsia and polyuria. Negative for cold intolerance, heat intolerance and polyphagia.  Musculoskeletal: Negative for arthralgias and myalgias.  Skin: Negative for color change, pallor, rash and wound.  Neurological: Negative for seizures and headaches.  Psychiatric/Behavioral: Negative for confusion and suicidal ideas.    Objective:    Vitals with BMI 05/10/2020 04/20/2019 10/02/2018  Height 5\' 7"  - 5\' 7"   Weight 164 lbs 3 oz - 171 lbs  BMI 25.71 - 26.78  Systolic 116 134  Diastolic 80 86 85  Pulse 92 80 102    BP 116/80   Pulse 92   Ht 5\' 7"  (1.702 m)   Wt 164 lb 3.2 oz (74.5 kg)   LMP 06/02/2016   BMI 25.72 kg/m   Wt Readings from Last 3 Encounters:  05/10/20 164 lb 3.2 oz (74.5 kg)  10/02/18 171 lb (77.6 kg)  09/18/18 175 lb (79.4 kg)     Physical Exam Constitutional:      Appearance: She is well-developed.  HENT:     Head: Normocephalic and atraumatic.  Neck:     Thyroid: No thyromegaly.     Trachea: No tracheal deviation.  Cardiovascular:     Rate and Rhythm: Normal rate and regular rhythm.  Pulmonary:     Effort: Pulmonary effort is normal.  Abdominal:     Tenderness: There is no abdominal tenderness. There is no guarding.  Musculoskeletal:        General: Normal range of motion.     Cervical back: Normal range of motion and neck supple.     Comments: Her foot exam is significant for diminished monofilament test bilaterally as well as diminished dorsalis pedis and posterior tibial arterial pulses.  Skin:    General: Skin is warm and dry.     Coloration: Skin is not pale.     Findings: No erythema or rash.  Neurological:     Mental Status: She is alert and oriented to person, place, and time.     Cranial Nerves: No cranial nerve deficit.     Coordination: Coordination normal.  Deep Tendon Reflexes: Reflexes are normal and symmetric.  Psychiatric:         Judgment: Judgment normal.       CMP ( most recent) CMP     Component Value Date/Time   NA 130 (L) 02/24/2015 1850   K 3.4 (L) 02/24/2015 1850   CL 95 (L) 02/24/2015 1850   CO2 23 02/24/2015 1850   GLUCOSE 495 (H) 02/24/2015 1850   BUN 7 11/26/2019 0000   CREATININE 0.6 11/26/2019 0000   CREATININE 1.04 (H) 02/24/2015 1850   CALCIUM 9.6 02/24/2015 1850   PROT 8.5 (H) 02/24/2015 1850   ALBUMIN 3.8 02/24/2015 1850   AST 25 02/24/2015 1850   ALT 22 02/24/2015 1850   ALKPHOS 121 02/24/2015 1850   BILITOT 1.1 02/24/2015 1850   GFRNONAA >60 11/26/2019 0000   GFRAA >60 11/26/2019 0000     Diabetic Labs (most recent): Lab Results  Component Value Date   HGBA1C 11.2 (A) 05/10/2020   HGBA1C 8.3 11/26/2019       Assessment & Plan:   1. Type 2 diabetes mellitus with hyperglycemia, with long-term current use of insulin (HCC)  - Laura Mullins has currently uncontrolled symptomatic type 2 DM since  54 years of age,  with most recent A1c of 11.2%. Recent labs reviewed. - I had a long discussion with her about the progressive nature of diabetes and the pathology behind its complications. -her diabetes is complicated by peripheral arterial disease, peripheral neuropathy and she remains at a high risk for more acute and chronic complications which include CAD, CVA, CKD, retinopathy, and neuropathy. These are all discussed in detail with her.  - I have counseled her on diet  and weight management  by adopting a carbohydrate restricted/protein rich diet. Patient is encouraged to switch to  unprocessed or minimally processed     complex starch and increased protein intake (animal or plant source), fruits, and vegetables. -  she is advised to stick to a routine mealtimes to eat 3 meals  a day and avoid unnecessary snacks ( to snack only to correct hypoglycemia).   - she admits that there is a room for improvement in her food and drink choices. - Suggestion is made for her to avoid  simple carbohydrates  from her diet including Cakes, Sweet Desserts, Ice Cream, Soda (diet and regular), Sweet Tea, Candies, Chips, Cookies, Store Bought Juices, Alcohol in Excess of  1-2 drinks a day, Artificial Sweeteners,  Coffee Creamer, and "Sugar-free" Products. This will help patient to have more stable blood glucose profile and potentially avoid unintended weight gain.  - she will be scheduled with Norm Salt, RDN, CDE for diabetes education.  - I have approached her with the following individualized plan to manage  her diabetes and patient agrees:   - she will continue to need insulin treatment in order for her to achieve control of diabetes to target.  She may need intensive treatment with basal/bolus insulin, although will be allowed to finish her current supplies of NovoLog 70/30 using 50 units with breakfast and supper when premeal blood glucose readings are above 90 mg/dl  associated with strict monitoring of glucose 4 times a day-before meals and at bedtime. - she is warned not to take insulin without proper monitoring per orders. - Adjustment parameters are given to her for hypo and hyperglycemia in writing. - she is encouraged to call clinic for blood glucose levels less than 70 or above 300 mg /dl. - she is  advised to continue Metformin 1000 mg p.o. twice daily, therapeutically suitable for patient .  -I discussed and added glipizide 5 mg XL p.o. daily at breakfast. -She is not a suitable candidate for incretin therapy, nor SGLT2 inhibitors due to her body habitus.  - Specific targets for  A1c;  LDL, HDL,  and Triglycerides were discussed with the patient.  2) Blood Pressure /Hypertension:  her blood pressure is  controlled to target.   she is advised to continue her current medications including lisinopril/HCTZ 20/12.5 mg p.o. daily.  3) Lipids/Hyperlipidemia: She does not have recent lipid panel to review.  She is advised to continue atorvastatin 40  mg daily at bedtime.   Side effects and precautions discussed with her.  4)  Weight/Diet:  Body mass index is 25.72 kg/m.  -     she is not  a candidate for weight loss. I discussed with her the fact that loss of 5 - 10% of her  current body weight will have the most impact on her diabetes management.  Exercise, and detailed carbohydrates information provided  -  detailed on discharge instructions.  5) Chronic Care/Health Maintenance:  -she  Is on ACEI/ARB and Statin medications and  is encouraged to initiate and continue to follow up with Ophthalmology, Dentist,  Podiatrist at least yearly or according to recommendations, and advised to   stay away from smoking. I have recommended yearly flu vaccine and pneumonia vaccine at least every 5 years; moderate intensity exercise for up to 150 minutes weekly; and  sleep for at least 7 hours a day.  - she is  advised to maintain close follow up with Avon Gully, MD for primary care needs, as well as her other providers for optimal and coordinated care.   - Time spent in this patient care: 60 min, of which > 50% was spent in  counseling  her about her currently uncontrolled type 2 diabetes, hyperlipidemia, hypertension and the rest reviewing her blood glucose logs , discussing her hypoglycemia and hyperglycemia episodes, reviewing her current and  previous labs / studies  ( including abstraction from other facilities) and medications  doses and developing a  long term treatment plan based on the latest standards of care/ guidelines; and documenting her care.    Please refer to Patient Instructions for Blood Glucose Monitoring and Insulin/Medications Dosing Guide"  in media tab for additional information. Please  also refer to " Patient Self Inventory" in the Media  tab for reviewed elements of pertinent patient history.  Laura Mullins participated in the discussions, expressed understanding, and voiced agreement with the above plans.  All questions were answered to her  satisfaction. she is encouraged to contact clinic should she have any questions or concerns prior to her return visit.   Follow up plan: - Return in about 10 days (around 05/20/2020) for F/U with Meter and Logs Only - no Labs.  Marquis Lunch, MD [facility] [address] [place], [place] [address] Phone: [phone]  Fax: [phone]    05/10/2020, 1:11 PM  This note was partially dictated with voice recognition software. Similar sounding words can be transcribed inadequately or may not  be corrected upon review.

## 2020-05-10 NOTE — Patient Instructions (Signed)

## 2020-05-13 ENCOUNTER — Ambulatory Visit (INDEPENDENT_AMBULATORY_CARE_PROVIDER_SITE_OTHER): Payer: Medicare Other | Admitting: "Endocrinology

## 2020-05-13 ENCOUNTER — Other Ambulatory Visit: Payer: Self-pay

## 2020-05-13 ENCOUNTER — Encounter: Payer: Self-pay | Admitting: "Endocrinology

## 2020-05-13 VITALS — BP 113/81 | HR 91 | Ht 67.0 in | Wt 163.4 lb

## 2020-05-13 DIAGNOSIS — Z794 Long term (current) use of insulin: Secondary | ICD-10-CM

## 2020-05-13 DIAGNOSIS — E1165 Type 2 diabetes mellitus with hyperglycemia: Secondary | ICD-10-CM | POA: Diagnosis not present

## 2020-05-13 DIAGNOSIS — E782 Mixed hyperlipidemia: Secondary | ICD-10-CM

## 2020-05-13 MED ORDER — TRESIBA FLEXTOUCH 200 UNIT/ML ~~LOC~~ SOPN: 80.0000 [IU] | PEN_INJECTOR | Freq: Every day | SUBCUTANEOUS | 2 refills | Status: DC

## 2020-05-13 MED ORDER — BD PEN NEEDLE SHORT U/F 31G X 8 MM MISC: 1.0000 | 3 refills | Status: DC

## 2020-05-13 NOTE — Progress Notes (Signed)
05/13/2020, 12:21 PM  Endocrinology follow-up note   Subjective:    Patient ID: Laura Mullins, female    DOB: 1966/01/06.  Laura Mullins is being seen in follow-up after she was seen in consultation for management of currently uncontrolled symptomatic diabetes requested by  Avon GullyFanta, Tesfaye, MD.   Past Medical History:  Diagnosis Date  . Arthritis   . Asthma   . Diabetes mellitus without complication (HCC)   . GERD (gastroesophageal reflux disease)   . Hyperlipidemia   . Hypertension   . Peptic ulcer   . Premenopause menorrhagia   . Trichimoniasis     Past Surgical History:  Procedure Laterality Date  . BACK SURGERY    . CHOLECYSTECTOMY      Social History   Socioeconomic History  . Marital status: Married    Spouse name: Not on file  . Number of children: Not on file  . Years of education: Not on file  . Highest education level: Not on file  Occupational History  . Not on file  Tobacco Use  . Smoking status: Never Smoker  . Smokeless tobacco: Never Used  Vaping Use  . Vaping Use: Never used  Substance and Sexual Activity  . Alcohol use: No  . Drug use: No  . Sexual activity: Not Currently    Birth control/protection: Post-menopausal  Other Topics Concern  . Not on file  Social History Narrative  . Not on file   Social Determinants of Health   Financial Resource Strain:   . Difficulty of Paying Living Expenses: Not on file  Food Insecurity:   . Worried About Programme researcher, broadcasting/film/videounning Out of Food in the Last Year: Not on file  . Ran Out of Food in the Last Year: Not on file  Transportation Needs:   . Lack of Transportation (Medical): Not on file  . Lack of Transportation (Non-Medical): Not on file  Physical Activity:   . Days of Exercise per Week: Not on file  . Minutes of Exercise per Session: Not on file  Stress:   . Feeling of Stress : Not on file  Social Connections:   .  Frequency of Communication with Friends and Family: Not on file  . Frequency of Social Gatherings with Friends and Family: Not on file  . Attends Religious Services: Not on file  . Active Member of Clubs or Organizations: Not on file  . Attends BankerClub or Organization Meetings: Not on file  . Marital Status: Not on file    Family History  Problem Relation Age of Onset  . Hypertension Mother   . Hyperlipidemia Mother   . Diabetes Mother   . Stroke Father   . Heart disease Father   . Heart attack Father   . Alcohol abuse Father   . Cancer Sister        leukemia  . Diabetes Sister   . Hypertension Sister   . Heart disease Sister   . Alcohol abuse Sister   . Schizophrenia Sister   . Cancer Brother   . Diabetes Brother   . Diabetes Maternal Grandmother   . Cancer  Maternal Grandmother   . Alzheimer's disease Maternal Grandmother   . Cancer Sister   . Diabetes Sister   . Cancer Sister   . Diabetes Sister   . Diabetes Sister   . Diabetes Sister   . Diabetes Sister   . Diabetes Sister   . Cancer Brother   . Diabetes Brother   . Diabetes Brother   . Diabetes Brother   . Diabetes Brother   . Heart disease Brother   . Diabetes Brother   . Cancer Brother        colon  . Diabetes Brother   . Hyperlipidemia Brother   . Diabetes Brother   . Diabetes Brother     Outpatient Encounter Medications as of 05/13/2020  Medication Sig  . albuterol (PROVENTIL HFA;VENTOLIN HFA) 108 (90 BASE) MCG/ACT inhaler Inhale 2 puffs into the lungs every 6 (six) hours as needed for wheezing or shortness of breath.  Marland Kitchen albuterol (PROVENTIL) (2.5 MG/3ML) 0.083% nebulizer solution Take 2.5 mg by nebulization every 6 (six) hours as needed for wheezing or shortness of breath.  Marland Kitchen aspirin EC 81 MG tablet Take 81 mg by mouth daily.  Marland Kitchen atorvastatin (LIPITOR) 40 MG tablet Take 40 mg by mouth daily.  Marland Kitchen gabapentin (NEURONTIN) 600 MG tablet Take 600 mg by mouth daily as needed (for neuropathy/pain).   Marland Kitchen glipiZIDE  (GLUCOTROL XL) 5 MG 24 hr tablet Take 1 tablet (5 mg total) by mouth daily with breakfast.  . insulin degludec (TRESIBA FLEXTOUCH) 200 UNIT/ML FlexTouch Pen Inject 80 Units into the skin at bedtime.  . Insulin Pen Needle (B-D ULTRAFINE III SHORT PEN) 31G X 8 MM MISC 1 each by Does not apply route as directed.  . linaclotide (LINZESS) 290 MCG CAPS capsule Take 1 capsule (290 mcg total) by mouth daily before breakfast. (Patient taking differently: Take 290 mcg by mouth as needed. )  . lisinopril-hydrochlorothiazide (ZESTORETIC) 20-12.5 MG per tablet Take 1 tablet by mouth daily.  Marland Kitchen loratadine (CLARITIN) 10 MG tablet Take 10 mg by mouth daily as needed for allergies.  . metFORMIN (GLUCOPHAGE) 1000 MG tablet Take 1,000 mg by mouth 2 (two) times daily with a meal.  . Multiple Vitamins-Calcium (ONE-A-DAY WOMENS PO) Take 1 tablet by mouth. 3 times a week  . omeprazole (PRILOSEC) 40 MG capsule Take 40 mg by mouth daily.  . [DISCONTINUED] insulin aspart protamine- aspart (NOVOLOG MIX 70/30) (70-30) 100 UNIT/ML injection Inject 50 Units into the skin 2 (two) times daily with a meal.   No facility-administered encounter medications on file as of 05/13/2020.    ALLERGIES: No Known Allergies  VACCINATION STATUS: Immunization History  Administered Date(s) Administered  . Influenza-Unspecified 09/03/2018  . Moderna SARS-COVID-2 Vaccination 11/30/2019, 12/28/2019    Diabetes She presents for her follow-up diabetic visit. She has type 2 diabetes mellitus. Onset time: She was diagnosed at approximate age of 40 years. Her disease course has been improving. There are no hypoglycemic associated symptoms. Pertinent negatives for hypoglycemia include no confusion, headaches, pallor or seizures. Associated symptoms include fatigue, polydipsia and polyuria. Pertinent negatives for diabetes include no chest pain and no polyphagia. There are no hypoglycemic complications. Symptoms are improving. There are no diabetic  complications. Risk factors for coronary artery disease include diabetes mellitus, dyslipidemia, hypertension and family history. Current diabetic treatments: She is currently on NovoLog 70/30 45 units twice daily, as well as Metformin 1000 g p.o. twice daily. Her weight is fluctuating minimally. She is following a generally unhealthy diet. When asked  about meal planning, she reported none. She has not had a previous visit with a dietitian. Her home blood glucose trend is decreasing steadily. Her breakfast blood glucose range is generally >200 mg/dl. Her lunch blood glucose range is generally >200 mg/dl. Her dinner blood glucose range is generally >200 mg/dl. Her bedtime blood glucose range is generally >200 mg/dl. Her overall blood glucose range is >200 mg/dl. (She presents with improved glycemic profile still above target averaging 200 to 250 mg/day.  Her recent point-of-care A1c was 11.2% increasing from 8.3%.   ) An ACE inhibitor/angiotensin II receptor blocker is being taken.  Hyperlipidemia This is a chronic problem. The current episode started more than 1 year ago. The problem is uncontrolled. Exacerbating diseases include diabetes. Pertinent negatives include no chest pain, myalgias or shortness of breath. Current antihyperlipidemic treatment includes statins. Risk factors for coronary artery disease include dyslipidemia, diabetes mellitus, hypertension, a sedentary lifestyle and family history.  Hypertension This is a chronic problem. The problem is controlled. Pertinent negatives include no chest pain, headaches, palpitations or shortness of breath. Risk factors for coronary artery disease include dyslipidemia, diabetes mellitus and family history. Past treatments include ACE inhibitors and diuretics.     Review of Systems  Constitutional: Positive for fatigue. Negative for chills, fever and unexpected weight change.  HENT: Negative for trouble swallowing and voice change.   Eyes: Negative for  visual disturbance.  Respiratory: Negative for cough, shortness of breath and wheezing.   Cardiovascular: Negative for chest pain, palpitations and leg swelling.  Gastrointestinal: Negative for diarrhea, nausea and vomiting.  Endocrine: Positive for polydipsia and polyuria. Negative for cold intolerance, heat intolerance and polyphagia.  Musculoskeletal: Negative for arthralgias and myalgias.  Skin: Negative for color change, pallor, rash and wound.  Neurological: Negative for seizures and headaches.  Psychiatric/Behavioral: Negative for confusion and suicidal ideas.    Objective:    Vitals with BMI 05/13/2020 05/10/2020 04/20/2019  Height   -  Weight 163 lbs 6 oz 164 lbs 3 oz -  BMI 25.59 25.71 -  Systolic 113 116 409  Diastolic 81 80 86  Pulse 91 92 80    BP 113/81   Pulse 91   Ht  (1.702 m)   Wt 163 lb 6.4 oz (74.1 kg)   LMP 06/02/2016   BMI 25.59 kg/m   Wt Readings from Last 3 Encounters:  05/13/20 163 lb 6.4 oz (74.1 kg)  05/10/20 164 lb 3.2 oz (74.5 kg)  10/02/18 171 lb (77.6 kg)     Physical Exam Constitutional:      Appearance: She is well-developed.  HENT:     Head: Normocephalic and atraumatic.  Neck:     Thyroid: No thyromegaly.     Trachea: No tracheal deviation.  Cardiovascular:     Rate and Rhythm: Normal rate and regular rhythm.  Pulmonary:     Effort: Pulmonary effort is normal.  Abdominal:     Tenderness: There is no abdominal tenderness. There is no guarding.  Musculoskeletal:        General: Normal range of motion.     Cervical back: Normal range of motion and neck supple.     Comments: Her foot exam is significant for diminished monofilament test bilaterally as well as diminished dorsalis pedis and posterior tibial arterial pulses.  Skin:    General: Skin is warm and dry.     Coloration: Skin is not pale.     Findings: No erythema or rash.  Neurological:  Mental Status: She is alert and oriented to person, place, and time.      Cranial Nerves: No cranial nerve deficit.     Coordination: Coordination normal.     Deep Tendon Reflexes: Reflexes are normal and symmetric.  Psychiatric:        Judgment: Judgment normal.     CMP ( most recent) CMP     Component Value Date/Time   NA 130 (L) 02/24/2015 1850   K 3.4 (L) 02/24/2015 1850   CL 95 (L) 02/24/2015 1850   CO2 23 02/24/2015 1850   GLUCOSE 495 (H) 02/24/2015 1850   BUN 7 11/26/2019 0000   CREATININE 0.6 11/26/2019 0000   CREATININE 1.04 (H) 02/24/2015 1850   CALCIUM 9.6 02/24/2015 1850   PROT 8.5 (H) 02/24/2015 1850   ALBUMIN 3.8 02/24/2015 1850   AST 25 02/24/2015 1850   ALT 22 02/24/2015 1850   ALKPHOS 121 02/24/2015 1850   BILITOT 1.1 02/24/2015 1850   GFRNONAA >60 11/26/2019 0000   GFRAA >60 11/26/2019 0000     Diabetic Labs (most recent): Lab Results  Component Value Date   HGBA1C 11.2 (A) 05/10/2020   HGBA1C 8.3 11/26/2019       Assessment & Plan:   1. Type 2 diabetes mellitus with hyperglycemia, with long-term current use of insulin (HCC)  - VIERA OKONSKI has currently uncontrolled symptomatic type 2 DM since  54 years of age. She presents with improved glycemic profile still above target averaging 200 to 250 mg/day.  Her recent point-of-care A1c was 11.2% increasing from 8.3%.   Recent labs reviewed. - I had a long discussion with her about the progressive nature of diabetes and the pathology behind its complications. -her diabetes is complicated by peripheral arterial disease, peripheral neuropathy and she remains at a high risk for more acute and chronic complications which include CAD, CVA, CKD, retinopathy, and neuropathy. These are all discussed in detail with her.  - I have counseled her on diet  and weight management  by adopting a carbohydrate restricted/protein rich diet. Patient is encouraged to switch to  unprocessed or minimally processed     complex starch and increased protein intake (animal or plant source),  fruits, and vegetables. -  she is advised to stick to a routine mealtimes to eat 3 meals  a day and avoid unnecessary snacks ( to snack only to correct hypoglycemia).   - she  admits there is a room for improvement in her diet and drink choices. -  Suggestion is made for her to avoid simple carbohydrates  from her diet including Cakes, Sweet Desserts / Pastries, Ice Cream, Soda (diet and regular), Sweet Tea, Candies, Chips, Cookies, Sweet Pastries,  Store Bought Juices, Alcohol in Excess of  1-2 drinks a day, Artificial Sweeteners, Coffee Creamer, and "Sugar-free" Products. This will help patient to have stable blood glucose profile and potentially avoid unintended weight gain.   - she will be scheduled with Norm Salt, RDN, CDE for diabetes education.  - I have approached her with the following individualized plan to manage  her diabetes and patient agrees:   - she will continue to need insulin treatment in order for her to achieve control of diabetes to target.  She would benefit from insulin analogs.  She is advised to discontinue NovoLog 70/30.  She is given a sample and switched to [place] 80 units nightly, associated with monitoring of blood glucose 4 times a day-before meals and at bedtime and return in  2 weeks for reevaluation.    -She will be considered for prandial insulin if she is found to have significant postprandial hyperglycemia.    - she is warned not to take insulin without proper monitoring per orders. - Adjustment parameters are given to her for hypo and hyperglycemia in writing. - she is encouraged to call clinic for blood glucose levels less than 70 or above 200 mg /dl. - she is advised to continue Metformin 1000 mg p.o. twice daily, therapeutically suitable for patient .  -She is advised to continue glipizide 5 mg XL p.o. daily at breakfast. -She is not a suitable candidate for incretin therapy, nor SGLT2 inhibitors due to her body habitus.  - Specific targets for   A1c;  LDL, HDL,  and Triglycerides were discussed with the patient.  2) Blood Pressure /Hypertension: Her blood pressure is controlled to target.  she is advised to continue her current medications including lisinopril/HCTZ 20/12.5 mg p.o. daily.  3) Lipids/Hyperlipidemia: She does not have recent lipid panel to review.  She is advised to continue atorvastatin 40  mg daily at bedtime.  Side effects and precautions discussed with her.  4)  Weight/Diet:  Body mass index is 25.59 kg/m.  -     she is not  a candidate for weight loss. I discussed with her the fact that loss of 5 - 10% of her  current body weight will have the most impact on her diabetes management.  Exercise, and detailed carbohydrates information provided  -  detailed on discharge instructions.  5) Chronic Care/Health Maintenance:  -she  Is on ACEI/ARB and Statin medications and  is encouraged to initiate and continue to follow up with Ophthalmology, Dentist,  Podiatrist at least yearly or according to recommendations, and advised to   stay away from smoking. I have recommended yearly flu vaccine and pneumonia vaccine at least every 5 years; moderate intensity exercise for up to 150 minutes weekly; and  sleep for at least 7 hours a day.  - she is  advised to maintain close follow up with Avon Gully, MD for primary care needs, as well as her other providers for optimal and coordinated care.   - Time spent on this patient care encounter:  35 min, of which > 50% was spent in  counseling and the rest reviewing her blood glucose logs , discussing her hypoglycemia and hyperglycemia episodes, reviewing her current and  previous labs / studies  ( including abstraction from other facilities) and medications  doses and developing a  long term treatment plan and documenting her care.   Please refer to Patient Instructions for Blood Glucose Monitoring and Insulin/Medications Dosing Guide"  in media tab for additional information. Please  also  refer to " Patient Self Inventory" in the Media  tab for reviewed elements of pertinent patient history.  Laura Rival participated in the discussions, expressed understanding, and voiced agreement with the above plans.  All questions were answered to her satisfaction. she is encouraged to contact clinic should she have any questions or concerns prior to her return visit.    Follow up plan: - Return in about 2 weeks (around 05/27/2020) for F/U with Meter and Logs Only - no Labs.  Marquis Lunch, MD [facility] [address] [place], [place] [address] Phone: [phone]  Fax: [phone]    05/13/2020, 12:21 PM  This note was partially dictated with voice recognition software. Similar sounding words can be transcribed inadequately  or may not  be corrected upon review.

## 2020-05-13 NOTE — Patient Instructions (Signed)

## 2020-05-30 ENCOUNTER — Encounter: Payer: Self-pay | Admitting: "Endocrinology

## 2020-05-30 ENCOUNTER — Ambulatory Visit (INDEPENDENT_AMBULATORY_CARE_PROVIDER_SITE_OTHER): Payer: Medicare Other | Admitting: "Endocrinology

## 2020-05-30 ENCOUNTER — Other Ambulatory Visit: Payer: Self-pay

## 2020-05-30 VITALS — BP 130/74 | HR 72 | Ht 67.0 in | Wt 176.8 lb

## 2020-05-30 DIAGNOSIS — E782 Mixed hyperlipidemia: Secondary | ICD-10-CM

## 2020-05-30 DIAGNOSIS — E1165 Type 2 diabetes mellitus with hyperglycemia: Secondary | ICD-10-CM | POA: Diagnosis not present

## 2020-05-30 DIAGNOSIS — Z794 Long term (current) use of insulin: Secondary | ICD-10-CM

## 2020-05-30 MED ORDER — TRESIBA FLEXTOUCH 200 UNIT/ML ~~LOC~~ SOPN: 60.0000 [IU] | PEN_INJECTOR | Freq: Every day | SUBCUTANEOUS | 2 refills | Status: DC

## 2020-05-30 NOTE — Patient Instructions (Signed)

## 2020-05-30 NOTE — Progress Notes (Signed)
05/30/2020, 12:41 PM  Endocrinology follow-up note   Subjective:    Patient ID: Laura Mullins, female    DOB: 1966-01-06.  Laura Mullins is being seen in follow-up after she was seen in consultation for management of currently uncontrolled symptomatic diabetes requested by  Avon GullyFanta, Tesfaye, MD.   Past Medical History:  Diagnosis Date  . Arthritis   . Asthma   . Diabetes mellitus without complication (HCC)   . GERD (gastroesophageal reflux disease)   . Hyperlipidemia   . Hypertension   . Peptic ulcer   . Premenopause menorrhagia   . Trichimoniasis     Past Surgical History:  Procedure Laterality Date  . BACK SURGERY    . CHOLECYSTECTOMY      Social History   Socioeconomic History  . Marital status: Married    Spouse name: Not on file  . Number of children: Not on file  . Years of education: Not on file  . Highest education level: Not on file  Occupational History  . Not on file  Tobacco Use  . Smoking status: Never Smoker  . Smokeless tobacco: Never Used  Vaping Use  . Vaping Use: Never used  Substance and Sexual Activity  . Alcohol use: No  . Drug use: No  . Sexual activity: Not Currently    Birth control/protection: Post-menopausal  Other Topics Concern  . Not on file  Social History Narrative  . Not on file   Social Determinants of Health   Financial Resource Strain:   . Difficulty of Paying Living Expenses: Not on file  Food Insecurity:   . Worried About Programme researcher, broadcasting/film/videounning Out of Food in the Last Year: Not on file  . Ran Out of Food in the Last Year: Not on file  Transportation Needs:   . Lack of Transportation (Medical): Not on file  . Lack of Transportation (Non-Medical): Not on file  Physical Activity:   . Days of Exercise per Week: Not on file  . Minutes of Exercise per Session: Not on file  Stress:   . Feeling of Stress : Not on file  Social Connections:   .  Frequency of Communication with Friends and Family: Not on file  . Frequency of Social Gatherings with Friends and Family: Not on file  . Attends Religious Services: Not on file  . Active Member of Clubs or Organizations: Not on file  . Attends BankerClub or Organization Meetings: Not on file  . Marital Status: Not on file    Family History  Problem Relation Age of Onset  . Hypertension Mother   . Hyperlipidemia Mother   . Diabetes Mother   . Stroke Father   . Heart disease Father   . Heart attack Father   . Alcohol abuse Father   . Cancer Sister        leukemia  . Diabetes Sister   . Hypertension Sister   . Heart disease Sister   . Alcohol abuse Sister   . Schizophrenia Sister   . Cancer Brother   . Diabetes Brother   . Diabetes Maternal Grandmother   . Cancer  Maternal Grandmother   . Alzheimer's disease Maternal Grandmother   . Cancer Sister   . Diabetes Sister   . Cancer Sister   . Diabetes Sister   . Diabetes Sister   . Diabetes Sister   . Diabetes Sister   . Diabetes Sister   . Cancer Brother   . Diabetes Brother   . Diabetes Brother   . Diabetes Brother   . Diabetes Brother   . Heart disease Brother   . Diabetes Brother   . Cancer Brother        colon  . Diabetes Brother   . Hyperlipidemia Brother   . Diabetes Brother   . Diabetes Brother     Outpatient Encounter Medications as of 05/30/2020  Medication Sig  . albuterol (PROVENTIL HFA;VENTOLIN HFA) 108 (90 BASE) MCG/ACT inhaler Inhale 2 puffs into the lungs every 6 (six) hours as needed for wheezing or shortness of breath.  Marland Kitchen albuterol (PROVENTIL) (2.5 MG/3ML) 0.083% nebulizer solution Take 2.5 mg by nebulization every 6 (six) hours as needed for wheezing or shortness of breath.  Marland Kitchen aspirin EC 81 MG tablet Take 81 mg by mouth daily.  Marland Kitchen atorvastatin (LIPITOR) 40 MG tablet Take 40 mg by mouth daily.  Marland Kitchen gabapentin (NEURONTIN) 600 MG tablet Take 600 mg by mouth daily as needed (for neuropathy/pain).   Marland Kitchen glipiZIDE  (GLUCOTROL XL) 5 MG 24 hr tablet Take 1 tablet (5 mg total) by mouth daily with breakfast.  . insulin degludec (TRESIBA FLEXTOUCH) 200 UNIT/ML FlexTouch Pen Inject 60 Units into the skin at bedtime.  . Insulin Pen Needle (B-D ULTRAFINE III SHORT PEN) 31G X 8 MM MISC 1 each by Does not apply route as directed.  . linaclotide (LINZESS) 290 MCG CAPS capsule Take 1 capsule (290 mcg total) by mouth daily before breakfast. (Patient taking differently: Take 290 mcg by mouth as needed. )  . lisinopril-hydrochlorothiazide (ZESTORETIC) 20-12.5 MG per tablet Take 1 tablet by mouth daily.  Marland Kitchen loratadine (CLARITIN) 10 MG tablet Take 10 mg by mouth daily as needed for allergies.  . metFORMIN (GLUCOPHAGE) 1000 MG tablet Take 1,000 mg by mouth 2 (two) times daily with a meal.  . Multiple Vitamins-Calcium (ONE-A-DAY WOMENS PO) Take 1 tablet by mouth. 3 times a week  . omeprazole (PRILOSEC) 40 MG capsule Take 40 mg by mouth daily.  . [DISCONTINUED] insulin degludec (TRESIBA FLEXTOUCH) 200 UNIT/ML FlexTouch Pen Inject 80 Units into the skin at bedtime.   No facility-administered encounter medications on file as of 05/30/2020.    ALLERGIES: No Known Allergies  VACCINATION STATUS: Immunization History  Administered Date(s) Administered  . Influenza-Unspecified 09/03/2018  . Moderna SARS-COVID-2 Vaccination 11/30/2019, 12/28/2019    Diabetes She presents for her follow-up diabetic visit. She has type 2 diabetes mellitus. Onset time: She was diagnosed at approximate age of 40 years. Her disease course has been improving. There are no hypoglycemic associated symptoms. Pertinent negatives for hypoglycemia include no confusion, headaches, pallor or seizures. Associated symptoms include fatigue. Pertinent negatives for diabetes include no chest pain, no polydipsia, no polyphagia and no polyuria. There are no hypoglycemic complications. Symptoms are improving. There are no diabetic complications. Risk factors for  coronary artery disease include diabetes mellitus, dyslipidemia, hypertension and family history. Current diabetic treatments: She is currently on NovoLog 70/30 45 units twice daily, as well as Metformin 1000 g p.o. twice daily. Her weight is fluctuating minimally. She is following a generally unhealthy diet. When asked about meal planning, she reported none. She  has not had a previous visit with a dietitian. Her home blood glucose trend is decreasing steadily. (She did not bring her meter for review.  She presents with a log showing improving glycemic profile, however her glucose readings are certainly suspicious.  Her recent point-of-care A1c was 11.2%.   ) An ACE inhibitor/angiotensin II receptor blocker is being taken.  Hyperlipidemia This is a chronic problem. The current episode started more than 1 year ago. The problem is uncontrolled. Exacerbating diseases include diabetes. Pertinent negatives include no chest pain, myalgias or shortness of breath. Current antihyperlipidemic treatment includes statins. Risk factors for coronary artery disease include dyslipidemia, diabetes mellitus, hypertension, a sedentary lifestyle and family history.  Hypertension This is a chronic problem. The problem is controlled. Pertinent negatives include no chest pain, headaches, palpitations or shortness of breath. Risk factors for coronary artery disease include dyslipidemia, diabetes mellitus and family history. Past treatments include ACE inhibitors and diuretics.     Review of Systems  Constitutional: Positive for fatigue. Negative for chills, fever and unexpected weight change.  HENT: Negative for trouble swallowing and voice change.   Eyes: Negative for visual disturbance.  Respiratory: Negative for cough, shortness of breath and wheezing.   Cardiovascular: Negative for chest pain, palpitations and leg swelling.  Gastrointestinal: Negative for diarrhea, nausea and vomiting.  Endocrine: Negative for cold  intolerance, heat intolerance, polydipsia, polyphagia and polyuria.  Musculoskeletal: Negative for arthralgias and myalgias.  Skin: Negative for color change, pallor, rash and wound.  Neurological: Negative for seizures and headaches.  Psychiatric/Behavioral: Negative for confusion and suicidal ideas.    Objective:    Vitals with BMI 05/30/2020 05/13/2020 05/10/2020  Height 5\' 7"  5\' 7"  5\' 7"   Weight 176 lbs 13 oz 163 lbs 6 oz 164 lbs 3 oz  BMI 27.68 25.59 25.71  Systolic 130 113  Diastolic 74 81 80  Pulse 72 91 92    BP 130/74   Pulse 72   Ht 5\' 7"  (1.702 m)   Wt 176 lb 12.8 oz (80.2 kg)   LMP 06/02/2016   BMI 27.69 kg/m   Wt Readings from Last 3 Encounters:  05/30/20 176 lb 12.8 oz (80.2 kg)  05/13/20 163 lb 6.4 oz (74.1 kg)  05/10/20 164 lb 3.2 oz (74.5 kg)     Physical Exam Constitutional:      Appearance: She is well-developed.  HENT:     Head: Normocephalic and atraumatic.  Neck:     Thyroid: No thyromegaly.     Trachea: No tracheal deviation.  Cardiovascular:     Rate and Rhythm: Normal rate and regular rhythm.  Pulmonary:     Effort: Pulmonary effort is normal.  Abdominal:     Tenderness: There is no abdominal tenderness. There is no guarding.  Musculoskeletal:        General: Normal range of motion.     Cervical back: Normal range of motion and neck supple.     Comments: Her foot exam is significant for diminished monofilament test bilaterally as well as diminished dorsalis pedis and posterior tibial arterial pulses.  Skin:    General: Skin is warm and dry.     Coloration: Skin is not pale.     Findings: No erythema or rash.  Neurological:     Mental Status: She is alert and oriented to person, place, and time.     Cranial Nerves: No cranial nerve deficit.     Coordination: Coordination normal.     Deep Tendon Reflexes:  Reflexes are normal and symmetric.  Psychiatric:        Judgment: Judgment normal.     CMP ( most recent) CMP     Component  Value Date/Time   NA 130 (L) 02/24/2015 1850   K 3.4 (L) 02/24/2015 1850   CL 95 (L) 02/24/2015 1850   CO2 23 02/24/2015 1850   GLUCOSE 495 (H) 02/24/2015 1850   BUN 7 11/26/2019 0000   CREATININE 0.6 11/26/2019 0000   CREATININE 1.04 (H) 02/24/2015 1850   CALCIUM 9.6 02/24/2015 1850   PROT 8.5 (H) 02/24/2015 1850   ALBUMIN 3.8 02/24/2015 1850   AST 25 02/24/2015 1850   ALT 22 02/24/2015 1850   ALKPHOS 121 02/24/2015 1850   BILITOT 1.1 02/24/2015 1850   GFRNONAA >60 11/26/2019 0000   GFRAA >60 11/26/2019 0000     Diabetic Labs (most recent): Lab Results  Component Value Date   HGBA1C 11.2 (A) 05/10/2020   HGBA1C 8.3 11/26/2019       Assessment & Plan:   1. Type 2 diabetes mellitus with hyperglycemia, with long-term current use of insulin (HCC)  - TAMERRA MERKLEY has currently uncontrolled symptomatic type 2 DM since  54 years of age. She did not bring her meter for review.  She presents with a log showing improving glycemic profile, however her glucose readings are certainly suspicious.  Her recent point-of-care A1c was 11.2%.   - I had a long discussion with her about the progressive nature of diabetes and the pathology behind its complications. -her diabetes is complicated by peripheral arterial disease, peripheral neuropathy and she remains at a high risk for more acute and chronic complications which include CAD, CVA, CKD, retinopathy, and neuropathy. These are all discussed in detail with her.  - I have counseled her on diet  and weight management  by adopting a carbohydrate restricted/protein rich diet. Patient is encouraged to switch to  unprocessed or minimally processed     complex starch and increased protein intake (animal or plant source), fruits, and vegetables. -  she is advised to stick to a routine mealtimes to eat 3 meals  a day and avoid unnecessary snacks ( to snack only to correct hypoglycemia).   - she  admits there is a room for improvement in her diet  and drink choices. -  Suggestion is made for her to avoid simple carbohydrates  from her diet including Cakes, Sweet Desserts / Pastries, Ice Cream, Soda (diet and regular), Sweet Tea, Candies, Chips, Cookies, Sweet Pastries,  Store Bought Juices, Alcohol in Excess of  1-2 drinks a day, Artificial Sweeteners, Coffee Creamer, and "Sugar-free" Products. This will help patient to have stable blood glucose profile and potentially avoid unintended weight gain.  - she will be scheduled with Norm Salt, RDN, CDE for diabetes education.  - I have approached her with the following individualized plan to manage  her diabetes and patient agrees:   - she will continue to need insulin treatment in order for her to achieve control of diabetes to target.   -She has responded to insulin analog, Evaristo Bury.  This will be lowered to 60 units nightly, advised to continue monitoring blood glucose twice a day -before breakfast and at bedtime.  She is advised to stay off of her NovoLog 70/30 for now. -She will be considered for prandial insulin if she is found to have significant postprandial hyperglycemia.    - she is warned not to take insulin without proper monitoring per orders. -  Adjustment parameters are given to her for hypo and hyperglycemia in writing. - she is encouraged to call clinic for blood glucose levels less than 70 or above 200 mg /dl. - she is advised to continue Metformin 1000 mg p.o. twice daily, therapeutically suitable for patient .  -She is advised to continue glipizide 5 mg XL p.o. daily at breakfast. -She is not a suitable candidate for incretin therapy, nor SGLT2 inhibitors due to her body habitus.  - Specific targets for  A1c;  LDL, HDL,  and Triglycerides were discussed with the patient.  2) Blood Pressure /Hypertension: Her blood pressure is controlled to target.  she is advised to continue her current medications including lisinopril/HCTZ 20/12.5 mg p.o. daily.  3)  Lipids/Hyperlipidemia: She does not have recent lipid panel to review.  She is advised to continue atorvastatin 40  mg daily at bedtime.  Side effects and precautions discussed with her.  4)  Weight/Diet:  Body mass index is 27.69 kg/m.  -     she is not  a candidate for weight loss. I discussed with her the fact that loss of 5 - 10% of her  current body weight will have the most impact on her diabetes management.  Exercise, and detailed carbohydrates information provided  -  detailed on discharge instructions.  5) Chronic Care/Health Maintenance:  -she  Is on ACEI/ARB and Statin medications and  is encouraged to initiate and continue to follow up with Ophthalmology, Dentist,  Podiatrist at least yearly or according to recommendations, and advised to   stay away from smoking. I have recommended yearly flu vaccine and pneumonia vaccine at least every 5 years; moderate intensity exercise for up to 150 minutes weekly; and  sleep for at least 7 hours a day.  - she is  advised to maintain close follow up with Avon Gully, MD for primary care needs, as well as her other providers for optimal and coordinated care.  - Time spent on this patient care encounter:  35 min, of which > 50% was spent in  counseling and the rest reviewing her blood glucose logs , discussing her hypoglycemia and hyperglycemia episodes, reviewing her current and  previous labs / studies  ( including abstraction from other facilities) and medications  doses and developing a  long term treatment plan and documenting her care.   Please refer to Patient Instructions for Blood Glucose Monitoring and Insulin/Medications Dosing Guide"  in media tab for additional information. Please  also refer to " Patient Self Inventory" in the Media  tab for reviewed elements of pertinent patient history.  Laura Rival participated in the discussions, expressed understanding, and voiced agreement with the above plans.  All questions were answered to  her satisfaction. she is encouraged to contact clinic should she have any questions or concerns prior to her return visit.   Follow up plan: - Return in about 10 weeks (around 08/08/2020) for Bring Meter and Logs- A1c in Office.  Marquis Lunch, MD [facility] [address] [place], [place] [address] Phone: [phone]  Fax: [phone]    05/30/2020, 12:41 PM  This note was partially dictated with voice recognition software. Similar sounding words can be transcribed inadequately or may not  be corrected upon review.

## 2020-06-08 DIAGNOSIS — J0101 Acute recurrent maxillary sinusitis: Secondary | ICD-10-CM | POA: Diagnosis not present

## 2020-06-08 DIAGNOSIS — I10 Essential (primary) hypertension: Secondary | ICD-10-CM | POA: Diagnosis not present

## 2020-06-08 DIAGNOSIS — J45909 Unspecified asthma, uncomplicated: Secondary | ICD-10-CM | POA: Diagnosis not present

## 2020-06-08 DIAGNOSIS — E119 Type 2 diabetes mellitus without complications: Secondary | ICD-10-CM | POA: Diagnosis not present

## 2020-06-08 DIAGNOSIS — Z20822 Contact with and (suspected) exposure to covid-19: Secondary | ICD-10-CM | POA: Diagnosis not present

## 2020-06-27 ENCOUNTER — Encounter: Payer: Self-pay | Admitting: Nutrition

## 2020-06-27 ENCOUNTER — Other Ambulatory Visit: Payer: Self-pay

## 2020-06-27 ENCOUNTER — Encounter: Payer: Medicare Other | Attending: "Endocrinology | Admitting: Nutrition

## 2020-06-27 VITALS — Ht 67.0 in | Wt 172.8 lb

## 2020-06-27 DIAGNOSIS — E782 Mixed hyperlipidemia: Secondary | ICD-10-CM | POA: Diagnosis not present

## 2020-06-27 DIAGNOSIS — E1165 Type 2 diabetes mellitus with hyperglycemia: Secondary | ICD-10-CM | POA: Diagnosis not present

## 2020-06-27 DIAGNOSIS — E669 Obesity, unspecified: Secondary | ICD-10-CM | POA: Diagnosis not present

## 2020-06-27 DIAGNOSIS — Z794 Long term (current) use of insulin: Secondary | ICD-10-CM | POA: Diagnosis not present

## 2020-06-27 NOTE — Patient Instructions (Signed)
Goals   Follow My Plate Method Eat meals on time Eat 2-3 carb choices per meal. Walk 15 minutes or more daily. Drink only water and cut out diet sodas. Test blood 4 times a day til next week and call.

## 2020-06-27 NOTE — Progress Notes (Signed)
Medical Nutrition Therapy:  Appt start time: 0800 end time:  0900.   Assessment:  Primary concerns today: Diabetes Type 2, Hyperlipidemia, overweight. Sees Dr. Fransico Him, Endocrinology. PCP Dr. Felecia Shelling. Never has seen a diabetes educator before. Did attend a class when she first became DM > 15 yrs ago. Married. Not working-disabled.  Has a lot neuropathy in her legs, feet and hands. Has has vision issues from her Dm and now wears glasses. Strong family history of DM and has lost a few of them due to complications from DM. She is 1 of 20 children born to her mom, who is still living and in a nursing home.  Eating 1-2 meals per day. Eats inconsistently. Admits to not eating whatever and not eating on time. Feels like she needs to work on timing of meals and amounts. DM Medications: Tresiba 60 units, Metformin 1000 mg twice a day. Glipizide 5 mg a day. Eating: Eats 1-2 meals per day. Eats larger portions of food at night. Skips a lot of meals and then overeats. Has signs and symptoms of hypo/hyperglycemia often. Doesn't eat on a schedule and just grabs and goes. Willing to make changes with her diet, medications and exercise.  Exercise: walks her dog a little.  Complains of neuropathy.   Lab Results  Component Value Date   HGBA1C 11.2 (A) 05/10/2020   CMP Latest Ref Rng & Units 11/26/2019 02/24/2015 09/05/2014  Glucose 65 - 99 mg/dL - 580(D) 983(J)  BUN 4 - 21 7 8 9   Creatinine 0.5 - 1.1 0.6 1.04(H) 0.93  Sodium 135 - 145 mmol/L - 130(L) 132(L)  Potassium 3.5 - 5.1 mmol/L - 3.4(L) 4.1  Chloride 101 - 111 mmol/L - 95(L) 101  CO2 22 - 32 mmol/L - 23 25  Calcium 8.9 - 10.3 mg/dL - 9.6 9.0  Total Protein 6.5 - 8.1 g/dL - 8.5(H) -  Total Bilirubin 0.3 - 1.2 mg/dL - 1.1 -  Alkaline Phos 38 - 126 U/L - 121 -  AST 15 - 41 U/L - 25 -  ALT 14 - 54 U/L - 22 -    Preferred Learning Style:   No preference indicated   Learning Readiness:   Change in progress   MEDICATIONS:    DIETARY INTAKE:      24-hr recall:  B ( AM): Banana, coffee-1 tbs sugar, 1 tbs creamer 8 pm: chicken wing from [employer], creamed potatoes, slaw, green beans, Water, crystal light.  Usual physical activity: ADL  Estimated energy needs: 1500  calories 170 g carbohydrates 112 g protein 42 g fat  Progress Towards Goal(s):  In progress.   Nutritional Diagnosis:  NB-1.1 Food and nutrition-related knowledge deficit As related to Diabetes Type 2.  As evidenced by A1C 11.2%..    Intervention: Nutrition and Diabetes education provided on My Plate, CHO counting, meal planning, portion sizes, timing of meals, avoiding snacks between meals unless having a low blood sugar, target ranges for A1C and blood sugars, signs/symptoms and treatment of hyper/hypoglycemia, monitoring blood sugars, taking medications as prescribed, benefits of exercising 30 minutes per day and prevention of complications of DM. Goals   Follow My Plate Method Eat meals on time Eat 2-3 carb choices per meal. Walk 15 minutes or more daily. Drink only water and cut out diet sodas. Test blood 4 times a day til next week and call.  Teaching Method Utilized:  Visual Auditory Hands on  Handouts given during visit include:  The Plate Method   Meal Plan Card  Diabetes Instructions.  Barriers to learning/adherence to lifestyle change: none  Demonstrated degree of understanding via:  Teach Back   Monitoring/Evaluation:  Dietary intake, exercise, , and body weight in 1 month(s).

## 2020-06-28 DIAGNOSIS — E1142 Type 2 diabetes mellitus with diabetic polyneuropathy: Secondary | ICD-10-CM | POA: Diagnosis not present

## 2020-06-28 DIAGNOSIS — Z23 Encounter for immunization: Secondary | ICD-10-CM | POA: Diagnosis not present

## 2020-06-28 DIAGNOSIS — Z1389 Encounter for screening for other disorder: Secondary | ICD-10-CM | POA: Diagnosis not present

## 2020-06-28 DIAGNOSIS — I10 Essential (primary) hypertension: Secondary | ICD-10-CM | POA: Diagnosis not present

## 2020-06-28 DIAGNOSIS — Z1331 Encounter for screening for depression: Secondary | ICD-10-CM | POA: Diagnosis not present

## 2020-06-28 DIAGNOSIS — E1165 Type 2 diabetes mellitus with hyperglycemia: Secondary | ICD-10-CM | POA: Diagnosis not present

## 2020-06-28 DIAGNOSIS — K219 Gastro-esophageal reflux disease without esophagitis: Secondary | ICD-10-CM | POA: Diagnosis not present

## 2020-07-01 ENCOUNTER — Other Ambulatory Visit (HOSPITAL_COMMUNITY): Payer: Self-pay | Admitting: Internal Medicine

## 2020-07-01 ENCOUNTER — Other Ambulatory Visit: Payer: Self-pay

## 2020-07-01 ENCOUNTER — Ambulatory Visit (HOSPITAL_COMMUNITY)
Admission: RE | Admit: 2020-07-01 | Discharge: 2020-07-01 | Disposition: A | Discharge status: Home / Self Care | Payer: Medicare Other | Source: Ambulatory Visit | Attending: Internal Medicine | Admitting: Internal Medicine

## 2020-07-01 DIAGNOSIS — Z1231 Encounter for screening mammogram for malignant neoplasm of breast: Secondary | ICD-10-CM | POA: Diagnosis not present

## 2020-07-21 ENCOUNTER — Encounter: Payer: Self-pay | Admitting: Nutrition

## 2020-07-21 ENCOUNTER — Encounter: Payer: Medicare Other | Attending: "Endocrinology | Admitting: Nutrition

## 2020-07-21 ENCOUNTER — Other Ambulatory Visit: Payer: Self-pay

## 2020-07-21 VITALS — Ht 67.0 in | Wt 171.6 lb

## 2020-07-21 DIAGNOSIS — E782 Mixed hyperlipidemia: Secondary | ICD-10-CM | POA: Diagnosis not present

## 2020-07-21 DIAGNOSIS — Z794 Long term (current) use of insulin: Secondary | ICD-10-CM | POA: Diagnosis not present

## 2020-07-21 DIAGNOSIS — E1165 Type 2 diabetes mellitus with hyperglycemia: Secondary | ICD-10-CM | POA: Diagnosis not present

## 2020-07-21 NOTE — Patient Instructions (Addendum)
Goals  Reduce Tresiba to 40 units daily. Prevent low blood sugars. Eat 3 carb choices per meals. Increase fiber intake to 25 g daily. Eat meals on time Follow My Plate Get I3H to 7% or less. Test blood sugars in am and bedtime.

## 2020-07-21 NOTE — Progress Notes (Signed)
  Medical Nutrition Therapy:  Appt start time: 1500 end time:  1530  Assessment:  Primary concerns today: Diabetes Type 2, Hyperlipidemia,  Sees Dr. Fransico Him, Endocrinology. PCP Dr. Felecia Shelling.  7 day day avg 84 mg/dl.  14 day avg 84 and 30 day avg 88 mg/dl. Tresiba 60 units, Metformin 1000 mg twice a day. Glipizide 5 mg a day. Changed: No longer skipping meals. Eating more fresh fruits and vegetables. Eating meals on time. Has had frequent low bloods of 50-70's.   Her neuropathy is so much better.  Eyes sight is so much better.  Lab Results  Component Value Date   HGBA1C 11.2 (A) 05/10/2020   CMP Latest Ref Rng & Units 11/26/2019 02/24/2015 09/05/2014  Glucose 65 - 99 mg/dL - 818(H) 631(S)  BUN 4 - 21 7 8 9   Creatinine 0.5 - 1.1 0.6 1.04(H) 0.93  Sodium 135 - 145 mmol/L - 130(L) 132(L)  Potassium 3.5 - 5.1 mmol/L - 3.4(L) 4.1  Chloride 101 - 111 mmol/L - 95(L) 101  CO2 22 - 32 mmol/L - 23 25  Calcium 8.9 - 10.3 mg/dL - 9.6 9.0  Total Protein 6.5 - 8.1 g/dL - 8.5(H) -  Total Bilirubin 0.3 - 1.2 mg/dL - 1.1 -  Alkaline Phos 38 - 126 U/L - 121 -  AST 15 - 41 U/L - 25 -  ALT 14 - 54 U/L - 22 -    Preferred Learning Style:   No preference indicated   Learning Readiness:   Change in progress   MEDICATIONS:    DIETARY INTAKE:     24-hr recall:  B ( AM):Cherrrios with milk, fruit, L) lettuce and tomato sandwich or salad with chicken , water D)Usually meat and vegetables, water  Usual physical activity: ADL  Estimated energy needs: 1500  calories 170 g carbohydrates 112 g protein 42 g fat  Progress Towards Goal(s):  In progress.   Nutritional Diagnosis:  NB-1.1 Food and nutrition-related knowledge deficit As related to Diabetes Type 2.  As evidenced by A1C 11.2%..    Intervention: Nutrition and Diabetes education provided on My Plate, CHO counting, meal planning, portion sizes, timing of meals, avoiding snacks between meals unless having a low blood sugar, target ranges for  A1C and blood sugars, signs/symptoms and treatment of hyper/hypoglycemia, monitoring blood sugars, taking medications as prescribed, benefits of exercising 30 minutes per day and prevention of complications of DM   Goals  Reduce Tresiba to 40 units daily. Prevent low blood sugars. Eat 3 carb choices per meals. Increase fiber intake to 25 g daily. Eat meals on time Follow My Plate Get to 7% or less. Test blood sugars in am and bedtime.   Teaching Method Utilized:  Visual Auditory Hands on  Handouts given during visit include:  The Plate Method   Meal Plan Card  Diabetes Instructions.  Barriers to learning/adherence to lifestyle change: none  Demonstrated degree of understanding via:  Teach Back   Monitoring/Evaluation:  Dietary intake, exercise, , and body weight in 3 month(s).

## 2020-08-03 ENCOUNTER — Encounter: Payer: Self-pay | Admitting: Nutrition

## 2020-08-08 ENCOUNTER — Encounter: Payer: Self-pay | Admitting: "Endocrinology

## 2020-08-08 ENCOUNTER — Encounter: Payer: Self-pay | Admitting: Nutrition

## 2020-08-08 ENCOUNTER — Other Ambulatory Visit: Payer: Self-pay

## 2020-08-08 ENCOUNTER — Encounter: Payer: Medicare Other | Attending: "Endocrinology | Admitting: Nutrition

## 2020-08-08 ENCOUNTER — Ambulatory Visit (INDEPENDENT_AMBULATORY_CARE_PROVIDER_SITE_OTHER): Payer: Medicare Other | Admitting: "Endocrinology

## 2020-08-08 VITALS — Ht 67.0 in | Wt 170.0 lb

## 2020-08-08 VITALS — BP 141/95 | HR 99 | Ht 67.0 in | Wt 170.0 lb

## 2020-08-08 DIAGNOSIS — E782 Mixed hyperlipidemia: Secondary | ICD-10-CM

## 2020-08-08 DIAGNOSIS — I10 Essential (primary) hypertension: Secondary | ICD-10-CM

## 2020-08-08 DIAGNOSIS — Z794 Long term (current) use of insulin: Secondary | ICD-10-CM | POA: Insufficient documentation

## 2020-08-08 DIAGNOSIS — E1165 Type 2 diabetes mellitus with hyperglycemia: Secondary | ICD-10-CM

## 2020-08-08 MED ORDER — TRESIBA FLEXTOUCH 200 UNIT/ML ~~LOC~~ SOPN: 20.0000 [IU] | PEN_INJECTOR | Freq: Every day | SUBCUTANEOUS | 1 refills | Status: DC

## 2020-08-08 NOTE — Patient Instructions (Signed)
Goals  Keep up the FANTASTIC JOB!!!! Call if any questions or symptoms of low blood sugars.

## 2020-08-08 NOTE — Patient Instructions (Signed)

## 2020-08-08 NOTE — Progress Notes (Signed)
  Medical Nutrition Therapy:  Appt start time: 0800  end time: 0830 Assessment:  Primary concerns today: Diabetes Type 2, Hyperlipidemia,  Sees Dr. Fransico Him, Endocrinology. PCP Dr. Felecia Shelling.   7 Day 101 mg/dl, 14 days 99 mg/dl,  30 days 91 mg/dl. Tresiba 40 units  And Metformin 1000 mg BID. Glipizide, 5 mg a day. Eating meals on time. Drinking water. Walks daily.  To see Dr. Fransico Him today. A1C : Has lost 6 lbs. Her neuropathy is so much better. Feels 100% better. Eyes sight is so much better. Can't get A1C today since hasn't been exactly 90 days. Will get it done later this week.   CMP Latest Ref Rng & Units 11/26/2019 02/24/2015 09/05/2014  Glucose 65 - 99 mg/dL - 322(G) 254(Y)  BUN 4 - 21 7 8 9   Creatinine 0.5 - 1.1 0.6 1.04(H) 0.93  Sodium 135 - 145 mmol/L - 130(L) 132(L)  Potassium 3.5 - 5.1 mmol/L - 3.4(L) 4.1  Chloride 101 - 111 mmol/L - 95(L) 101  CO2 22 - 32 mmol/L - 23 25  Calcium 8.9 - 10.3 mg/dL - 9.6 9.0  Total Protein 6.5 - 8.1 g/dL - 8.5(H) -  Total Bilirubin 0.3 - 1.2 mg/dL - 1.1 -  Alkaline Phos 38 - 126 U/L - 121 -  AST 15 - 41 U/L - 25 -  ALT 14 - 54 U/L - 22 -    Preferred Learning Style:   No preference indicated   Learning Readiness:   Change in progress   MEDICATIONS:    DIETARY INTAKE:     24-hr recall:  B ( AM):Cherrrios with milk, fruit, L) lettuce and tomato sandwich or salad with chicken , water D)Usually meat and vegetables, water  Usual physical activity: ADL  Estimated energy needs: 1500  calories 170 g carbohydrates 112 g protein 42 g fat  Progress Towards Goal(s):  In progress.   Nutritional Diagnosis:  NB-1.1 Food and nutrition-related knowledge deficit As related to Diabetes Type 2.  As evidenced by A1C 11.2%..    Intervention: Nutrition and Diabetes education provided on My Plate, CHO counting, meal planning, portion sizes, timing of meals, avoiding snacks between meals unless having a low blood sugar, target ranges for A1C and blood  sugars, signs/symptoms and treatment of hyper/hypoglycemia, monitoring blood sugars, taking medications as prescribed, benefits of exercising 30 minutes per day and prevention of complications of DM    Goals  Keep up the FANTASTIC JOB!!!! Call if any questions or symptoms of low blood sugars.   Teaching Method Utilized:  Visual Auditory Hands on  Handouts given during visit include:  The Plate Method   Meal Plan Card  Diabetes Instructions.  Barriers to learning/adherence to lifestyle change: none  Demonstrated degree of understanding via:  Teach Back   Monitoring/Evaluation:  Dietary intake, exercise, , and body weight in 3 month(s).

## 2020-08-08 NOTE — Progress Notes (Signed)
08/08/2020, 12:26 PM  Endocrinology follow-up note   Subjective:    Patient ID: Laura Mullins, female    DOB: 25-Feb-1966.  Laura Mullins is being seen in follow-up after she was seen in consultation for management of currently uncontrolled symptomatic diabetes requested by  Avon Gully, MD.   Past Medical History:  Diagnosis Date  . Arthritis   . Asthma   . Diabetes mellitus without complication (HCC)   . GERD (gastroesophageal reflux disease)   . Hyperlipidemia   . Hypertension   . Peptic ulcer   . Premenopause menorrhagia   . Trichimoniasis     Past Surgical History:  Procedure Laterality Date  . BACK SURGERY    . CHOLECYSTECTOMY      Social History   Socioeconomic History  . Marital status: Married    Spouse name: Not on file  . Number of children: Not on file  . Years of education: Not on file  . Highest education level: Not on file  Occupational History  . Not on file  Tobacco Use  . Smoking status: Never Smoker  . Smokeless tobacco: Never Used  Vaping Use  . Vaping Use: Never used  Substance and Sexual Activity  . Alcohol use: No  . Drug use: No  . Sexual activity: Not Currently    Birth control/protection: Post-menopausal  Other Topics Concern  . Not on file  Social History Narrative  . Not on file   Social Determinants of Health   Financial Resource Strain:   . Difficulty of Paying Living Expenses: Not on file  Food Insecurity:   . Worried About Programme researcher, broadcasting/film/video in the Last Year: Not on file  . Ran Out of Food in the Last Year: Not on file  Transportation Needs:   . Lack of Transportation (Medical): Not on file  . Lack of Transportation (Non-Medical): Not on file  Physical Activity:   . Days of Exercise per Week: Not on file  . Minutes of Exercise per Session: Not on file  Stress:   . Feeling of Stress : Not on file  Social Connections:   .  Frequency of Communication with Friends and Family: Not on file  . Frequency of Social Gatherings with Friends and Family: Not on file  . Attends Religious Services: Not on file  . Active Member of Clubs or Organizations: Not on file  . Attends Banker Meetings: Not on file  . Marital Status: Not on file    Family History  Problem Relation Age of Onset  . Hypertension Mother   . Hyperlipidemia Mother   . Diabetes Mother   . Stroke Father   . Heart disease Father   . Heart attack Father   . Alcohol abuse Father   . Cancer Sister        leukemia  . Diabetes Sister   . Hypertension Sister   . Heart disease Sister   . Alcohol abuse Sister   . Schizophrenia Sister   . Cancer Brother   . Diabetes Brother   . Diabetes Maternal Grandmother   . Cancer  Maternal Grandmother   . Alzheimer's disease Maternal Grandmother   . Cancer Sister   . Diabetes Sister   . Cancer Sister   . Diabetes Sister   . Diabetes Sister   . Diabetes Sister   . Diabetes Sister   . Diabetes Sister   . Cancer Brother   . Diabetes Brother   . Diabetes Brother   . Diabetes Brother   . Diabetes Brother   . Heart disease Brother   . Diabetes Brother   . Cancer Brother        colon  . Diabetes Brother   . Hyperlipidemia Brother   . Diabetes Brother   . Diabetes Brother     Outpatient Encounter Medications as of 08/08/2020  Medication Sig  . albuterol (PROVENTIL HFA;VENTOLIN HFA) 108 (90 BASE) MCG/ACT inhaler Inhale 2 puffs into the lungs every 6 (six) hours as needed for wheezing or shortness of breath.  Marland Kitchen albuterol (PROVENTIL) (2.5 MG/3ML) 0.083% nebulizer solution Take 2.5 mg by nebulization every 6 (six) hours as needed for wheezing or shortness of breath.  Marland Kitchen aspirin EC 81 MG tablet Take 81 mg by mouth daily.  Marland Kitchen atorvastatin (LIPITOR) 40 MG tablet Take 40 mg by mouth daily.  Marland Kitchen gabapentin (NEURONTIN) 600 MG tablet Take 600 mg by mouth daily as needed (for neuropathy/pain).   Marland Kitchen glipiZIDE  (GLUCOTROL XL) 5 MG 24 hr tablet Take 1 tablet (5 mg total) by mouth daily with breakfast.  . insulin degludec (TRESIBA FLEXTOUCH) 200 UNIT/ML FlexTouch Pen Inject 20 Units into the skin at bedtime.  . Insulin Pen Needle (B-D ULTRAFINE III SHORT PEN) 31G X 8 MM MISC 1 each by Does not apply route as directed.  . linaclotide (LINZESS) 290 MCG CAPS capsule Take 1 capsule (290 mcg total) by mouth daily before breakfast. (Patient taking differently: Take 290 mcg by mouth as needed. )  . lisinopril-hydrochlorothiazide (ZESTORETIC) 20-12.5 MG per tablet Take 1 tablet by mouth daily.  Marland Kitchen loratadine (CLARITIN) 10 MG tablet Take 10 mg by mouth daily as needed for allergies.  . metFORMIN (GLUCOPHAGE) 1000 MG tablet Take 1,000 mg by mouth 2 (two) times daily with a meal.  . Multiple Vitamins-Calcium (ONE-A-DAY WOMENS PO) Take 1 tablet by mouth. 3 times a week  . omeprazole (PRILOSEC) 40 MG capsule Take 40 mg by mouth daily.  . [DISCONTINUED] insulin degludec (TRESIBA FLEXTOUCH) 200 UNIT/ML FlexTouch Pen Inject 60 Units into the skin at bedtime. (Patient taking differently: Inject 40 Units into the skin at bedtime. )   No facility-administered encounter medications on file as of 08/08/2020.    ALLERGIES: No Known Allergies  VACCINATION STATUS: Immunization History  Administered Date(s) Administered  . Influenza-Unspecified 09/03/2018  . Moderna SARS-COVID-2 Vaccination 11/30/2019, 12/28/2019    Diabetes She presents for her follow-up diabetic visit. She has type 2 diabetes mellitus. Onset time: She was diagnosed at approximate age of 40 years. Her disease course has been improving. There are no hypoglycemic associated symptoms. Pertinent negatives for hypoglycemia include no confusion, headaches, pallor or seizures (.1a). Pertinent negatives for diabetes include no chest pain, no fatigue, no polydipsia, no polyphagia and no polyuria. There are no hypoglycemic complications. Symptoms are improving. There  are no diabetic complications. Risk factors for coronary artery disease include diabetes mellitus, dyslipidemia, hypertension and family history. Current diabetic treatments: She is currently on NovoLog 70/30 45 units twice daily, as well as Metformin 1000 g p.o. twice daily. Her weight is stable. She is following a generally unhealthy  diet. When asked about meal planning, she reported none. She has not had a previous visit with a dietitian. Her home blood glucose trend is decreasing steadily. Her breakfast blood glucose range is generally 90-110 mg/dl. Her bedtime blood glucose range is generally 110-130 mg/dl. Her overall blood glucose range is 110-130 mg/dl. (She presents with significant improvement in her glycemic profile averaging between 99-115 for the last 90 days.  She is not due for her next A1c.  Her previous visit point-of-care A1c was 11.2%.  Due to tightening glycemic profile her Laura Mullins was lowered to 40 units since last visit.   ) An ACE inhibitor/angiotensin II receptor blocker is being taken.  Hyperlipidemia This is a chronic problem. The current episode started more than 1 year ago. The problem is uncontrolled. Exacerbating diseases include diabetes. Pertinent negatives include no chest pain, myalgias or shortness of breath. Current antihyperlipidemic treatment includes statins. Risk factors for coronary artery disease include dyslipidemia, diabetes mellitus, hypertension, a sedentary lifestyle and family history.  Hypertension This is a chronic problem. The problem is controlled. Pertinent negatives include no chest pain, headaches, palpitations or shortness of breath. Risk factors for coronary artery disease include dyslipidemia, diabetes mellitus and family history. Past treatments include ACE inhibitors and diuretics.     Review of Systems  Constitutional: Negative for chills, fatigue, fever and unexpected weight change.  HENT: Negative for trouble swallowing and voice change.    Eyes: Negative for visual disturbance.  Respiratory: Negative for cough, shortness of breath and wheezing.   Cardiovascular: Negative for chest pain, palpitations and leg swelling.  Gastrointestinal: Negative for diarrhea, nausea and vomiting.  Endocrine: Negative for cold intolerance, heat intolerance, polydipsia, polyphagia and polyuria.  Musculoskeletal: Negative for arthralgias and myalgias.  Skin: Negative for color change, pallor, rash and wound.  Neurological: Negative for seizures (.1a) and headaches.  Psychiatric/Behavioral: Negative for confusion and suicidal ideas.    Objective:    Vitals with BMI 08/08/2020 08/08/2020 07/21/2020  Height 5\' 7"  5\' 7"  5\' 7"   Weight 170 lbs 170 lbs 171 lbs 10 oz  BMI 26.62 26.62 26.87  Systolic 141 - -  Diastolic 95 - -  Pulse 99 - -    BP (!) 141/95   Pulse 99   Ht 5\' 7"  (1.702 m)   Wt 170 lb (77.1 kg)   LMP 06/02/2016   BMI 26.63 kg/m   Wt Readings from Last 3 Encounters:  08/08/20 170 lb (77.1 kg)  08/08/20 170 lb (77.1 kg)  07/21/20 171 lb 9.6 oz (77.8 kg)     Physical Exam Constitutional:      Appearance: She is well-developed.  HENT:     Head: Normocephalic and atraumatic.  Neck:     Thyroid: No thyromegaly.     Trachea: No tracheal deviation.  Cardiovascular:     Rate and Rhythm: Normal rate and regular rhythm.  Pulmonary:     Effort: Pulmonary effort is normal.  Abdominal:     Tenderness: There is no abdominal tenderness. There is no guarding.  Musculoskeletal:        General: Normal range of motion.     Cervical back: Normal range of motion and neck supple.     Comments: Her foot exam is significant for diminished monofilament test bilaterally as well as diminished dorsalis pedis and posterior tibial arterial pulses.  Skin:    General: Skin is warm and dry.     Coloration: Skin is not pale.     Findings: No erythema  or rash.  Neurological:     Mental Status: She is alert and oriented to person, place, and  time.     Cranial Nerves: No cranial nerve deficit.     Coordination: Coordination normal.     Deep Tendon Reflexes: Reflexes are normal and symmetric.  Psychiatric:        Judgment: Judgment normal.     CMP ( most recent) CMP     Component Value Date/Time   NA 130 (L) 02/24/2015 1850   K 3.4 (L) 02/24/2015 1850   CL 95 (L) 02/24/2015 1850   CO2 23 02/24/2015 1850   GLUCOSE 495 (H) 02/24/2015 1850   BUN 7 11/26/2019 0000   CREATININE 0.6 11/26/2019 0000   CREATININE 1.04 (H) 02/24/2015 1850   CALCIUM 9.6 02/24/2015 1850   PROT 8.5 (H) 02/24/2015 1850   ALBUMIN 3.8 02/24/2015 1850   AST 25 02/24/2015 1850   ALT 22 02/24/2015 1850   ALKPHOS 121 02/24/2015 1850   BILITOT 1.1 02/24/2015 1850   GFRNONAA >60 11/26/2019 0000   GFRAA >60 11/26/2019 0000     Diabetic Labs (most recent): Lab Results  Component Value Date   HGBA1C 11.2 (A) 05/10/2020   HGBA1C 8.3 11/26/2019     Assessment & Plan:   1. Type 2 diabetes mellitus with hyperglycemia, with long-term current use of insulin (HCC)  - Laura Mullins has currently uncontrolled symptomatic type 2 DM since  54 years of age.  She presents with significant improvement in her glycemic profile averaging between 99-115 for the last 90 days.  She is not due for her next A1c.  Her previous visit point-of-care A1c was 11.2%.  Due to tightening glycemic profile her Laura Mullins was lowered to 40 units since last visit.    - I had a long discussion with her about the progressive nature of diabetes and the pathology behind its complications. -her diabetes is complicated by peripheral arterial disease, peripheral neuropathy and she remains at a high risk for more acute and chronic complications which include CAD, CVA, CKD, retinopathy, and neuropathy. These are all discussed in detail with her.  - I have counseled her on diet  and weight management  by adopting a carbohydrate restricted/protein rich diet. Patient is encouraged to switch  to  unprocessed or minimally processed     complex starch and increased protein intake (animal or plant source), fruits, and vegetables. -  she is advised to stick to a routine mealtimes to eat 3 meals  a day and avoid unnecessary snacks ( to snack only to correct hypoglycemia).   - she  admits there is a room for improvement in her diet and drink choices. -  Suggestion is made for her to avoid simple carbohydrates  from her diet including Cakes, Sweet Desserts / Pastries, Ice Cream, Soda (diet and regular), Sweet Tea, Candies, Chips, Cookies, Sweet Pastries,  Store Bought Juices, Alcohol in Excess of  1-2 drinks a day, Artificial Sweeteners, Coffee Creamer, and "Sugar-free" Products. This will help patient to have stable blood glucose profile and potentially avoid unintended weight gain.  - she will continue follow-up with  Norm Salt, RDN, CDE for diabetes education.  - I have approached her with the following individualized plan to manage  her diabetes and patient agrees:   -In light of her presentation with significant response to insulin treatment with tightly controlled glycemic profile, she is advised to lower her Tresiba to 20 units nightly, associated with monitoring of blood glucose  twice a day-daily before breakfast and at bedtime.    -She we will not need prandial insulin for now.  She is advised to stay off of her premixed insulin 70/30.   - she is encouraged to call clinic for blood glucose levels less than 70 or above 200 mg /dl. - she is advised to continue Metformin 1000 mg p.o. twice daily, therapeutically suitable for patient .  -She is advised to continue glipizide 5 mg XL p.o. daily at breakfast. -She is not a suitable candidate for incretin therapy, nor SGLT2 inhibitors due to her body habitus.  - Specific targets for  A1c;  LDL, HDL,  and Triglycerides were discussed with the patient.  2) Blood Pressure /Hypertension:  Her blood pressure is not  controlled to target.   she is advised to continue her current medications including lisinopril/HCTZ 20/12.5 mg p.o. daily.   3) Lipids/Hyperlipidemia: She does not have recent lipid panel to review.  She is advised to continue atorvastatin 40 mg p.o. daily at bedtime.  Side effects and precautions discussed with her.  4)  Weight/Diet:  Body mass index is 26.63 kg/m.  -     she is not  a candidate for weight loss. I discussed with her the fact that loss of 5 - 10% of her  current body weight will have the most impact on her diabetes management.  Exercise, and detailed carbohydrates information provided  -  detailed on discharge instructions.  5) Chronic Care/Health Maintenance:  -she  Is on ACEI/ARB and Statin medications and  is encouraged to initiate and continue to follow up with Ophthalmology, Dentist,  Podiatrist at least yearly or according to recommendations, and advised to   stay away from smoking. I have recommended yearly flu vaccine and pneumonia vaccine at least every 5 years; moderate intensity exercise for up to 150 minutes weekly; and  sleep for at least 7 hours a day.  Her ABI is normal today. POCT ABI Results 08/08/20   Right ABI: 1.23      left ABI: 1.17  Right leg systolic / diastolic: 173/101 mmHg Left leg systolic / diastolic: 165/96 mmHg  Arm systolic / diastolic: 141/95 mmHG   - she is  advised to maintain close follow up with Avon GullyFanta, Tesfaye, MD for primary care needs, as well as her other providers for optimal and coordinated care.  - Time spent on this patient care encounter:  35 min, of which > 50% was spent in  counseling and the rest reviewing her blood glucose logs , discussing her hypoglycemia and hyperglycemia episodes, reviewing her current and  previous labs / studies  ( including abstraction from other facilities) and medications  doses and developing a  long term treatment plan and documenting her care.   Please refer to Patient Instructions for Blood Glucose Monitoring and  Insulin/Medications Dosing Guide"  in media tab for additional information. Please  also refer to " Patient Self Inventory" in the Media  tab for reviewed elements of pertinent patient history.  Laura Mullins participated in the discussions, expressed understanding, and voiced agreement with the above plans.  All questions were answered to her satisfaction. she is encouraged to contact clinic should she have any questions or concerns prior to her return visit.    Follow up plan: - Return in about 4 months (around 12/07/2020), or She will come next week for A1c, for Urine MA - NV, F/U with Pre-visit Labs, Meter, Logs, A1c here.Marquis Lunch.  Gebre Emmalia Heyboer, MD  [facility] [address] [place], [place] [address] Phone: [phone]  Fax: [phone]    08/08/2020, 12:26 PM  This note was partially dictated with voice recognition software. Similar sounding words can be transcribed inadequately or may not  be corrected upon review.

## 2020-08-12 ENCOUNTER — Other Ambulatory Visit: Payer: Self-pay | Admitting: "Endocrinology

## 2020-08-12 DIAGNOSIS — E1165 Type 2 diabetes mellitus with hyperglycemia: Secondary | ICD-10-CM | POA: Diagnosis not present

## 2020-08-12 DIAGNOSIS — Z794 Long term (current) use of insulin: Secondary | ICD-10-CM | POA: Diagnosis not present

## 2020-08-13 LAB — COMPLETE METABOLIC PANEL WITH GFR
AG Ratio: 1.4 (calc) (ref 1.0–2.5)
ALT: 18 U/L (ref 6–29)
AST: 17 U/L (ref 10–35)
Albumin: 4 g/dL (ref 3.6–5.1)
Alkaline phosphatase (APISO): 99 U/L (ref 37–153)
BUN/Creatinine Ratio: 9 (calc) (ref 6–22)
BUN: 6 mg/dL — ABNORMAL LOW (ref 7–25)
CO2: 29 mmol/L (ref 20–32)
Calcium: 9.3 mg/dL (ref 8.6–10.4)
Chloride: 106 mmol/L (ref 98–110)
Creat: 0.69 mg/dL (ref 0.50–1.05)
GFR, Est African American: 114 mL/min/{1.73_m2} (ref >=60)
GFR, Est Non African American: 99 mL/min/{1.73_m2} (ref >=60)
Globulin: 2.9 g/dL (calc) (ref 1.9–3.7)
Glucose, Bld: 86 mg/dL (ref 65–99)
Potassium: 3.7 mmol/L (ref 3.5–5.3)
Sodium: 144 mmol/L (ref 135–146)
Total Bilirubin: 0.5 mg/dL (ref 0.2–1.2)
Total Protein: 6.9 g/dL (ref 6.1–8.1)

## 2020-08-13 LAB — TSH: TSH: 1.07 mIU/L

## 2020-08-13 LAB — LIPID PANEL
Cholesterol: 243 mg/dL — ABNORMAL HIGH (ref <200)
HDL: 42 mg/dL — ABNORMAL LOW (ref >=50)
LDL Cholesterol (Calc): 173 mg/dL (calc) — ABNORMAL HIGH
Non-HDL Cholesterol (Calc): 201 mg/dL (calc) — ABNORMAL HIGH (ref <130)
Total CHOL/HDL Ratio: 5.8 (calc) — ABNORMAL HIGH (ref <5.0)
Triglycerides: 139 mg/dL (ref <150)

## 2020-08-13 LAB — T4, FREE: Free T4: 1 ng/dL (ref 0.8–1.8)

## 2020-08-16 ENCOUNTER — Ambulatory Visit (INDEPENDENT_AMBULATORY_CARE_PROVIDER_SITE_OTHER): Payer: Medicare Other | Admitting: "Endocrinology

## 2020-08-16 ENCOUNTER — Other Ambulatory Visit: Payer: Self-pay

## 2020-08-16 DIAGNOSIS — E1165 Type 2 diabetes mellitus with hyperglycemia: Secondary | ICD-10-CM | POA: Diagnosis not present

## 2020-08-16 DIAGNOSIS — Z794 Long term (current) use of insulin: Secondary | ICD-10-CM

## 2020-08-16 LAB — POCT UA - MICROALBUMIN: Microalbumin Ur, POC: 30 mg/L

## 2020-08-16 LAB — POCT GLYCOSYLATED HEMOGLOBIN (HGB A1C): HbA1c, POC (controlled diabetic range): 5.5 % (ref 0.0–7.0)

## 2020-08-16 NOTE — Progress Notes (Signed)
Patient here for Nurse only visit for a1c and urine micro albumin.Dr. Fransico Him was given results, advised per DR.Nida to discontinue Glipizide, patient verbalized understanding

## 2020-09-06 DIAGNOSIS — Z79899 Other long term (current) drug therapy: Secondary | ICD-10-CM | POA: Diagnosis not present

## 2020-09-06 DIAGNOSIS — J4540 Moderate persistent asthma, uncomplicated: Secondary | ICD-10-CM | POA: Diagnosis not present

## 2020-09-06 DIAGNOSIS — E1165 Type 2 diabetes mellitus with hyperglycemia: Secondary | ICD-10-CM | POA: Diagnosis not present

## 2020-09-06 DIAGNOSIS — I10 Essential (primary) hypertension: Secondary | ICD-10-CM | POA: Diagnosis not present

## 2020-09-06 DIAGNOSIS — E1142 Type 2 diabetes mellitus with diabetic polyneuropathy: Secondary | ICD-10-CM | POA: Diagnosis not present

## 2020-09-06 DIAGNOSIS — K219 Gastro-esophageal reflux disease without esophagitis: Secondary | ICD-10-CM | POA: Diagnosis not present

## 2020-09-06 DIAGNOSIS — Z0001 Encounter for general adult medical examination with abnormal findings: Secondary | ICD-10-CM | POA: Diagnosis not present

## 2020-11-21 ENCOUNTER — Other Ambulatory Visit: Payer: Self-pay

## 2020-11-21 ENCOUNTER — Telehealth: Payer: Self-pay

## 2020-11-21 DIAGNOSIS — E1165 Type 2 diabetes mellitus with hyperglycemia: Secondary | ICD-10-CM

## 2020-11-21 DIAGNOSIS — Z794 Long term (current) use of insulin: Secondary | ICD-10-CM

## 2020-11-21 MED ORDER — TRESIBA FLEXTOUCH 200 UNIT/ML ~~LOC~~ SOPN: 20.0000 [IU] | PEN_INJECTOR | Freq: Every day | SUBCUTANEOUS | 1 refills | Status: DC

## 2020-11-21 MED ORDER — BD PEN NEEDLE SHORT U/F 31G X 8 MM MISC: 1.0000 | 3 refills | Status: DC

## 2020-11-21 NOTE — Telephone Encounter (Signed)
Sent in

## 2020-11-21 NOTE — Telephone Encounter (Signed)
Pt needs her Laura Mullins prescription sent to [facility] in [place] - also she needs her pen needles for this sent in. Please Advise.

## 2020-11-22 ENCOUNTER — Other Ambulatory Visit: Payer: Self-pay

## 2020-11-22 ENCOUNTER — Other Ambulatory Visit (HOSPITAL_COMMUNITY)
Admission: RE | Admit: 2020-11-22 | Discharge: 2020-11-22 | Disposition: A | Discharge status: Home / Self Care | Payer: Managed Care, Other (non HMO) | Source: Ambulatory Visit | Attending: Obstetrics & Gynecology | Admitting: Obstetrics & Gynecology

## 2020-11-22 ENCOUNTER — Other Ambulatory Visit (INDEPENDENT_AMBULATORY_CARE_PROVIDER_SITE_OTHER): Payer: Managed Care, Other (non HMO) | Admitting: *Deleted

## 2020-11-22 DIAGNOSIS — N898 Other specified noninflammatory disorders of vagina: Secondary | ICD-10-CM | POA: Diagnosis not present

## 2020-11-22 NOTE — Progress Notes (Signed)
   NURSE VISIT- VAGINITIS/STD/POC  SUBJECTIVE:  Laura Mullins is a 55 y.o. [id number] GYN patientfemale here for a vaginal swab for vaginitis screening.  She reports the following symptoms: discharge described as yellow for 2 weeks. Denies abnormal vaginal bleeding, significant pelvic pain, fever, or UTI symptoms.  OBJECTIVE:  LMP 06/02/2016   Appears well, in no apparent distress  ASSESSMENT: Vaginal swab for vaginitis screening  PLAN: Self-collected vaginal probe for Gonorrhea, Chlamydia, Trichomonas, Bacterial Vaginosis, Yeast sent to lab Treatment: to be determined once results are received Follow-up as needed if symptoms persist/worsen, or new symptoms develop  Annamarie Dawley  11/22/2020 3:03 PM

## 2020-11-22 NOTE — Progress Notes (Signed)
Chart reviewed for nurse visit. Agree with plan of care.  Adline Potter, NP 11/22/2020 5:09 PM

## 2020-11-24 LAB — CERVICOVAGINAL ANCILLARY ONLY
Bacterial Vaginitis (gardnerella): POSITIVE — AB
Candida Glabrata: NEGATIVE
Candida Vaginitis: NEGATIVE
Chlamydia: NEGATIVE
Comment: NEGATIVE
Comment: NEGATIVE
Comment: NEGATIVE
Comment: NEGATIVE
Comment: NEGATIVE
Comment: NORMAL
Neisseria Gonorrhea: NEGATIVE
Trichomonas: POSITIVE — AB

## 2020-11-25 ENCOUNTER — Encounter: Payer: Self-pay | Admitting: Adult Health

## 2020-11-25 ENCOUNTER — Telehealth: Payer: Self-pay | Admitting: Adult Health

## 2020-11-25 DIAGNOSIS — A599 Trichomoniasis, unspecified: Secondary | ICD-10-CM

## 2020-11-25 HISTORY — DX: Trichomoniasis, unspecified: A59.9

## 2020-11-25 MED ORDER — METRONIDAZOLE 500 MG PO TABS: 500.0000 mg | ORAL_TABLET | Freq: Three times a day (TID) | ORAL | 0 refills | Status: DC

## 2020-11-25 NOTE — Telephone Encounter (Signed)
Pt informed that +trich and BV will rx flagyl 500 mg 1 tid x 7 days, is not sexually active, no alcohol, do proof of cure in about 2 weeks with self swab

## 2020-12-06 ENCOUNTER — Other Ambulatory Visit: Payer: Self-pay

## 2020-12-06 DIAGNOSIS — E782 Mixed hyperlipidemia: Secondary | ICD-10-CM

## 2020-12-06 DIAGNOSIS — I10 Essential (primary) hypertension: Secondary | ICD-10-CM

## 2020-12-06 DIAGNOSIS — E1165 Type 2 diabetes mellitus with hyperglycemia: Secondary | ICD-10-CM

## 2020-12-07 ENCOUNTER — Ambulatory Visit: Payer: Medicare Other | Admitting: "Endocrinology

## 2020-12-09 LAB — LIPID PANEL
Chol/HDL Ratio: 4.8 ratio — ABNORMAL HIGH (ref 0.0–4.4)
Cholesterol, Total: 209 mg/dL — ABNORMAL HIGH (ref 100–199)
HDL: 44 mg/dL (ref >39)
LDL Chol Calc (NIH): 145 mg/dL — ABNORMAL HIGH (ref 0–99)
Triglycerides: 110 mg/dL (ref 0–149)
VLDL Cholesterol Cal: 20 mg/dL (ref 5–40)

## 2020-12-09 LAB — COMPREHENSIVE METABOLIC PANEL
ALT: 15 IU/L (ref 0–32)
AST: 19 IU/L (ref 0–40)
Albumin/Globulin Ratio: 1.5 (ref 1.2–2.2)
Albumin: 4.1 g/dL (ref 3.8–4.9)
Alkaline Phosphatase: 82 IU/L (ref 44–121)
BUN/Creatinine Ratio: 9 (ref 9–23)
BUN: 7 mg/dL (ref 6–24)
Bilirubin Total: 0.6 mg/dL (ref 0.0–1.2)
CO2: 25 mmol/L (ref 20–29)
Calcium: 9.3 mg/dL (ref 8.7–10.2)
Chloride: 101 mmol/L (ref 96–106)
Creatinine, Ser: 0.74 mg/dL (ref 0.57–1.00)
Globulin, Total: 2.8 g/dL (ref 1.5–4.5)
Glucose: 79 mg/dL (ref 65–99)
Potassium: 3.5 mmol/L (ref 3.5–5.2)
Sodium: 144 mmol/L (ref 134–144)
Total Protein: 6.9 g/dL (ref 6.0–8.5)
eGFR: 96 mL/min/{1.73_m2} (ref >59)

## 2020-12-09 LAB — T4, FREE: Free T4: 1.17 ng/dL (ref 0.82–1.77)

## 2020-12-09 LAB — TSH: TSH: 2 u[IU]/mL (ref 0.450–4.500)

## 2020-12-14 ENCOUNTER — Ambulatory Visit (INDEPENDENT_AMBULATORY_CARE_PROVIDER_SITE_OTHER): Payer: Managed Care, Other (non HMO) | Admitting: "Endocrinology

## 2020-12-14 ENCOUNTER — Encounter: Payer: Self-pay | Admitting: "Endocrinology

## 2020-12-14 ENCOUNTER — Other Ambulatory Visit: Payer: Self-pay

## 2020-12-14 VITALS — BP 136/82 | HR 100 | Ht 67.0 in | Wt 158.8 lb

## 2020-12-14 DIAGNOSIS — I10 Essential (primary) hypertension: Secondary | ICD-10-CM | POA: Diagnosis not present

## 2020-12-14 DIAGNOSIS — Z794 Long term (current) use of insulin: Secondary | ICD-10-CM | POA: Diagnosis not present

## 2020-12-14 DIAGNOSIS — E1165 Type 2 diabetes mellitus with hyperglycemia: Secondary | ICD-10-CM

## 2020-12-14 DIAGNOSIS — E782 Mixed hyperlipidemia: Secondary | ICD-10-CM

## 2020-12-14 LAB — POCT UA - MICROALBUMIN
Albumin/Creatinine Ratio, Urine, POC: <30
Creatinine, POC: 300 mg/dL
Microalbumin Ur, POC: 80 mg/L

## 2020-12-14 LAB — POCT GLYCOSYLATED HEMOGLOBIN (HGB A1C): HbA1c, POC (controlled diabetic range): 5.4 % (ref 0.0–7.0)

## 2020-12-14 MED ORDER — ATORVASTATIN CALCIUM 40 MG PO TABS: 40.0000 mg | ORAL_TABLET | Freq: Every day | ORAL | 1 refills | Status: DC

## 2020-12-14 NOTE — Patient Instructions (Signed)

## 2020-12-14 NOTE — Progress Notes (Signed)
12/14/2020, 4:44 PM  Endocrinology follow-up note   Subjective:    Patient ID: Laura Mullins, female    DOB: 17-Apr-1966.  Laura Mullins is being seen in follow-up after she was seen in consultation for management of currently uncontrolled symptomatic diabetes requested by  Avon Gully, MD.   Past Medical History:  Diagnosis Date  . Arthritis   . Asthma   . Diabetes mellitus without complication (HCC)   . GERD (gastroesophageal reflux disease)   . Hyperlipidemia   . Hypertension   . Peptic ulcer   . Premenopause menorrhagia   . Trichimoniasis   . Trichimoniasis 11/25/2020   11/25/20 treated    Past Surgical History:  Procedure Laterality Date  . BACK SURGERY    . CHOLECYSTECTOMY      Social History   Socioeconomic History  . Marital status: Married    Spouse name: Not on file  . Number of children: Not on file  . Years of education: Not on file  . Highest education level: Not on file  Occupational History  . Not on file  Tobacco Use  . Smoking status: Never Smoker  . Smokeless tobacco: Never Used  Vaping Use  . Vaping Use: Never used  Substance and Sexual Activity  . Alcohol use: No  . Drug use: No  . Sexual activity: Not Currently    Birth control/protection: Post-menopausal  Other Topics Concern  . Not on file  Social History Narrative  . Not on file   Social Determinants of Health   Financial Resource Strain: Not on file  Food Insecurity: Not on file  Transportation Needs: Not on file  Physical Activity: Not on file  Stress: Not on file  Social Connections: Not on file    Family History  Problem Relation Age of Onset  . Hypertension Mother   . Hyperlipidemia Mother   . Diabetes Mother   . Stroke Father   . Heart disease Father   . Heart attack Father   . Alcohol abuse Father   . Cancer Sister        leukemia  . Diabetes Sister   . Hypertension  Sister   . Heart disease Sister   . Alcohol abuse Sister   . Schizophrenia Sister   . Cancer Brother   . Diabetes Brother   . Diabetes Maternal Grandmother   . Cancer Maternal Grandmother   . Alzheimer's disease Maternal Grandmother   . Cancer Sister   . Diabetes Sister   . Cancer Sister   . Diabetes Sister   . Diabetes Sister   . Diabetes Sister   . Diabetes Sister   . Diabetes Sister   . Cancer Brother   . Diabetes Brother   . Diabetes Brother   . Diabetes Brother   . Diabetes Brother   . Heart disease Brother   . Diabetes Brother   . Cancer Brother        colon  . Diabetes Brother   . Hyperlipidemia Brother   . Diabetes Brother   . Diabetes Brother     Outpatient Encounter Medications as of  12/14/2020  Medication Sig  . albuterol (PROVENTIL HFA;VENTOLIN HFA) 108 (90 BASE) MCG/ACT inhaler Inhale 2 puffs into the lungs every 6 (six) hours as needed for wheezing or shortness of breath.  Marland Kitchen. albuterol (PROVENTIL) (2.5 MG/3ML) 0.083% nebulizer solution Take 2.5 mg by nebulization every 6 (six) hours as needed for wheezing or shortness of breath.  Marland Kitchen. aspirin EC 81 MG tablet Take 81 mg by mouth daily.  Marland Kitchen. atorvastatin (LIPITOR) 40 MG tablet Take 1 tablet (40 mg total) by mouth daily.  Marland Kitchen. gabapentin (NEURONTIN) 600 MG tablet Take 600 mg by mouth daily as needed (for neuropathy/pain).   . Insulin Pen Needle (B-D ULTRAFINE III SHORT PEN) 31G X 8 MM MISC 1 each by Does not apply route as directed.  . linaclotide (LINZESS) 290 MCG CAPS capsule Take 1 capsule (290 mcg total) by mouth daily before breakfast. (Patient taking differently: Take 290 mcg by mouth as needed. )  . lisinopril-hydrochlorothiazide (ZESTORETIC) 20-12.5 MG per tablet Take 1 tablet by mouth daily.  Marland Kitchen. loratadine (CLARITIN) 10 MG tablet Take 10 mg by mouth daily as needed for allergies.  . metFORMIN (GLUCOPHAGE) 1000 MG tablet Take 1,000 mg by mouth 2 (two) times daily with a meal.  . metroNIDAZOLE (FLAGYL) 500 MG tablet  Take 1 tablet (500 mg total) by mouth 3 (three) times daily. (Patient not taking: Reported on 12/14/2020)  . Multiple Vitamins-Calcium (ONE-A-DAY WOMENS PO) Take 1 tablet by mouth. 3 times a week  . omeprazole (PRILOSEC) 40 MG capsule Take 40 mg by mouth daily.  . [DISCONTINUED] atorvastatin (LIPITOR) 40 MG tablet Take 40 mg by mouth daily.  . [DISCONTINUED] glipiZIDE (GLUCOTROL XL) 5 MG 24 hr tablet Take 1 tablet (5 mg total) by mouth daily with breakfast.  . [DISCONTINUED] insulin degludec (TRESIBA FLEXTOUCH) 200 UNIT/ML FlexTouch Pen Inject 20 Units into the skin at bedtime.   No facility-administered encounter medications on file as of 12/14/2020.    ALLERGIES: No Known Allergies  VACCINATION STATUS: Immunization History  Administered Date(s) Administered  . Influenza-Unspecified 09/03/2018  . Moderna Sars-Covid-2 Vaccination 11/30/2019, 12/28/2019    Diabetes She presents for her follow-up diabetic visit. She has type 2 diabetes mellitus. Onset time: She was diagnosed at approximate age of 40 years. Her disease course has been improving. There are no hypoglycemic associated symptoms. Pertinent negatives for hypoglycemia include no confusion, headaches, pallor or seizures (.1a). Pertinent negatives for diabetes include no chest pain, no fatigue, no polydipsia, no polyphagia and no polyuria. There are no hypoglycemic complications. Symptoms are improving. There are no diabetic complications. Risk factors for coronary artery disease include diabetes mellitus, dyslipidemia, hypertension and family history. Current diabetic treatments: She is currently on NovoLog 70/30 45 units twice daily, as well as Metformin 1000 g p.o. twice daily. Her weight is decreasing steadily. She is following a generally unhealthy diet. When asked about meal planning, she reported none. She has not had a previous visit with a dietitian. Her home blood glucose trend is decreasing steadily. Her overall blood glucose range  is 110-130 mg/dl. (She presents with further improvement in her glycemic profile with point-of-care A1c of 5.4% today.  She has tight fasting glycemic profile.  Her A1c has improved from 11% overall.  She has not used [place] for more than a month in the interval, due to lapse in her insurance.) An ACE inhibitor/angiotensin II receptor blocker is being taken.  Hyperlipidemia This is a chronic problem. The current episode started more than 1 year ago. The problem  is uncontrolled. Exacerbating diseases include diabetes. Pertinent negatives include no chest pain, myalgias or shortness of breath. Current antihyperlipidemic treatment includes statins. Risk factors for coronary artery disease include dyslipidemia, diabetes mellitus, hypertension, a sedentary lifestyle and family history.  Hypertension This is a chronic problem. The problem is controlled. Pertinent negatives include no chest pain, headaches, palpitations or shortness of breath. Risk factors for coronary artery disease include dyslipidemia, diabetes mellitus and family history. Past treatments include ACE inhibitors and diuretics.     Review of Systems  Constitutional: Negative for chills, fatigue, fever and unexpected weight change.  HENT: Negative for trouble swallowing and voice change.   Eyes: Negative for visual disturbance.  Respiratory: Negative for cough, shortness of breath and wheezing.   Cardiovascular: Negative for chest pain, palpitations and leg swelling.  Gastrointestinal: Negative for diarrhea, nausea and vomiting.  Endocrine: Negative for cold intolerance, heat intolerance, polydipsia, polyphagia and polyuria.  Musculoskeletal: Negative for arthralgias and myalgias.  Skin: Negative for color change, pallor, rash and wound.  Neurological: Negative for seizures (.1a) and headaches.  Psychiatric/Behavioral: Negative for confusion and suicidal ideas.    Objective:    Vitals with BMI 12/14/2020 08/08/2020 08/08/2020  Height      Weight 158 lbs 13 oz 170 lbs 170 lbs  BMI 24.87 26.62 26.62  Systolic 136 141 -  Diastolic 82 95 -  Pulse 100 99 -    BP 136/82   Pulse 100   Ht  (1.702 m)   Wt 158 lb 12.8 oz (72 kg)   LMP 06/02/2016   BMI 24.87 kg/m   Wt Readings from Last 3 Encounters:  12/14/20 158 lb 12.8 oz (72 kg)  08/08/20 170 lb (77.1 kg)  08/08/20 170 lb (77.1 kg)     Physical Exam Constitutional:      Appearance: She is well-developed.  HENT:     Head: Normocephalic and atraumatic.  Neck:     Thyroid: No thyromegaly.     Trachea: No tracheal deviation.  Cardiovascular:     Rate and Rhythm: Normal rate and regular rhythm.  Pulmonary:     Effort: Pulmonary effort is normal.  Abdominal:     Tenderness: There is no abdominal tenderness. There is no guarding.  Musculoskeletal:        General: Normal range of motion.     Cervical back: Normal range of motion and neck supple.     Comments: Her foot exam is significant for diminished monofilament test bilaterally as well as diminished dorsalis pedis and posterior tibial arterial pulses.  Skin:    General: Skin is warm and dry.     Coloration: Skin is not pale.     Findings: No erythema or rash.  Neurological:     Mental Status: She is alert and oriented to person, place, and time.     Cranial Nerves: No cranial nerve deficit.     Coordination: Coordination normal.     Deep Tendon Reflexes: Reflexes are normal and symmetric.  Psychiatric:        Judgment: Judgment normal.     CMP ( most recent) CMP     Component Value Date/Time   NA 144 12/08/2020 0936   K 3.5 12/08/2020 0936   CL 101 12/08/2020 0936   CO2 25 12/08/2020 0936   GLUCOSE 79 12/08/2020 0936   GLUCOSE 86 08/12/2020 1045   BUN 7 12/08/2020 0936   CREATININE 0.74 12/08/2020 0936   CREATININE 0.69 08/12/2020 1045  CALCIUM 9.3 12/08/2020 0936   PROT 6.9 12/08/2020 0936   ALBUMIN 4.1 12/08/2020 0936   AST 19 12/08/2020 0936   ALT 15 12/08/2020  0936   ALKPHOS 82 12/08/2020 0936   BILITOT 0.6 12/08/2020 0936   GFRNONAA 99 08/12/2020 1045   GFRAA 114 08/12/2020 1045     Diabetic Labs (most recent): Lab Results  Component Value Date   HGBA1C 5.4 12/14/2020   HGBA1C 5.5 08/16/2020   HGBA1C 11.2 (A) 05/10/2020     Assessment & Plan:   1. Type 2 diabetes mellitus with hyperglycemia, with long-term current use of insulin (HCC)  - Laura Mullins has currently uncontrolled symptomatic type 2 DM since  55 years of age.  She presents with further improvement in her glycemic profile with point-of-care A1c of 5.4% today.  She has tight fasting glycemic profile.  Her A1c has improved from 11% overall.  She has not used [place] for more than a month in the interval, due to lapse in her insurance.  - I had a long discussion with her about the progressive nature of diabetes and the pathology behind its complications. -her diabetes is complicated by peripheral arterial disease, peripheral neuropathy and she remains at a high risk for more acute and chronic complications which include CAD, CVA, CKD, retinopathy, and neuropathy. These are all discussed in detail with her.  - I have counseled her on diet  and weight management  by adopting a carbohydrate restricted/protein rich diet. Patient is encouraged to switch to  unprocessed or minimally processed     complex starch and increased protein intake (animal or plant source), fruits, and vegetables. -  she is advised to stick to a routine mealtimes to eat 3 meals  a day and avoid unnecessary snacks ( to snack only to correct hypoglycemia).   - she acknowledges that there is a room for improvement in her food and drink choices. - Suggestion is made for her to avoid simple carbohydrates  from her diet including Cakes, Sweet Desserts, Ice Cream, Soda (diet and regular), Sweet Tea, Candies, Chips, Cookies, Store Bought Juices, Alcohol in Excess of  1-2 drinks a day, Artificial Sweeteners,  Coffee  Creamer, and "Sugar-free" Products, Lemonade. This will help patient to have more stable blood glucose profile and potentially avoid unintended weight gain.   - she will continue follow-up with  Norm Salt, RDN, CDE for diabetes education.  - I have approached her with the following individualized plan to manage  her diabetes and patient agrees:   -In light of her presentation with tight glycemic profile at fasting, she is advised to stop all insulin at this time.   - she is encouraged to call clinic for blood glucose levels less than 70 or above 200 mg /dl. - she is advised to continue Metformin 1000 mg p.o. twice daily, therapeutically suitable for patient . -She was taken off of glipizide during her last visit.   -She is not a suitable candidate for incretin therapy, nor SGLT2 inhibitors due to her body habitus.  - Specific targets for  A1c;  LDL, HDL,  and Triglycerides were discussed with the patient.  2) Blood Pressure /Hypertension:  -Her blood pressure is controlled to target.  She has mild microalbuminuria. she is advised to continue her current medications including lisinopril/HCTZ 20/12.5 mg p.o. daily.   3) Lipids/Hyperlipidemia: Her recent lipid panel showed uncontrolled LDL at 145.  She was not taking her Lipitor, she is advised to resume Lipitor 40  mg p.o. nightly.   Side effects and precautions discussed with her.  4)  Weight/Diet:  Body mass index is 24.87 kg/m.  -   She has lost 12 pounds since December 2021.  This is helping her diabetes and high blood pressure.  she is not  a candidate for any more major weight loss.  Exercise, and detailed carbohydrates information provided  -  detailed on discharge instructions.  5) Chronic Care/Health Maintenance:  -she  Is on ACEI/ARB and Statin medications and  is encouraged to initiate and continue to follow up with Ophthalmology, Dentist,  Podiatrist at least yearly or according to recommendations, and advised to   stay away  from smoking. I have recommended yearly flu vaccine and pneumonia vaccine at least every 5 years; moderate intensity exercise for up to 150 minutes weekly; and  sleep for at least 7 hours a day.  Her screening ABI was negative for PAD in December 2021.  This study will be repeated in 2026 or sooner if needed.    - she is  advised to maintain close follow up with Avon Gully, MD for primary care needs, as well as her other providers for optimal and coordinated care.    I spent 42 minutes in the care of the patient today including review of labs from CMP, Lipids, Thyroid Function, Hematology (current and previous including abstractions from other facilities); face-to-face time discussing  her blood glucose readings/logs, discussing hypoglycemia and hyperglycemia episodes and symptoms, medications doses, her options of short and long term treatment based on the latest standards of care / guidelines;  discussion about incorporating lifestyle medicine;  and documenting the encounter.    Please refer to Patient Instructions for Blood Glucose Monitoring and Insulin/Medications Dosing Guide"  in media tab for additional information. Please  also refer to " Patient Self Inventory" in the Media  tab for reviewed elements of pertinent patient history. We discussed in detail about type 2 diabetes, hyperlipidemia, hypertension, weight management. Laura Mullins participated in the discussions, expressed understanding, and voiced agreement with the above plans.  All questions were answered to her satisfaction. she is encouraged to contact clinic should she have any questions or concerns prior to her return visit.   Follow up plan: - Return in about 4 months (around 04/15/2021) for Bring Meter and Logs- A1c in Office, [place] with Shakesha Soltau.  Marquis Lunch, MD [facility] [address] [place], [place] [address] Phone: [phone]  Fax: [phone]    12/14/2020,  4:44 PM  This note was partially dictated with voice recognition software. Similar sounding words can be transcribed inadequately or may not  be corrected upon review.

## 2021-01-19 DIAGNOSIS — E1165 Type 2 diabetes mellitus with hyperglycemia: Secondary | ICD-10-CM | POA: Diagnosis not present

## 2021-01-19 DIAGNOSIS — I10 Essential (primary) hypertension: Secondary | ICD-10-CM | POA: Diagnosis not present

## 2021-01-19 DIAGNOSIS — J309 Allergic rhinitis, unspecified: Secondary | ICD-10-CM | POA: Diagnosis not present

## 2021-01-19 DIAGNOSIS — K219 Gastro-esophageal reflux disease without esophagitis: Secondary | ICD-10-CM | POA: Diagnosis not present

## 2021-04-11 DIAGNOSIS — K219 Gastro-esophageal reflux disease without esophagitis: Secondary | ICD-10-CM | POA: Diagnosis not present

## 2021-04-11 DIAGNOSIS — J4540 Moderate persistent asthma, uncomplicated: Secondary | ICD-10-CM | POA: Diagnosis not present

## 2021-04-11 DIAGNOSIS — E1165 Type 2 diabetes mellitus with hyperglycemia: Secondary | ICD-10-CM | POA: Diagnosis not present

## 2021-04-11 DIAGNOSIS — I10 Essential (primary) hypertension: Secondary | ICD-10-CM | POA: Diagnosis not present

## 2021-04-20 ENCOUNTER — Ambulatory Visit: Payer: Medicare Other | Admitting: "Endocrinology

## 2021-04-27 ENCOUNTER — Other Ambulatory Visit: Payer: Self-pay

## 2021-04-27 ENCOUNTER — Encounter: Payer: Self-pay | Admitting: "Endocrinology

## 2021-04-27 ENCOUNTER — Ambulatory Visit (INDEPENDENT_AMBULATORY_CARE_PROVIDER_SITE_OTHER): Payer: Managed Care, Other (non HMO) | Admitting: "Endocrinology

## 2021-04-27 VITALS — BP 136/82 | HR 92 | Ht 67.0 in | Wt 155.2 lb

## 2021-04-27 DIAGNOSIS — I10 Essential (primary) hypertension: Secondary | ICD-10-CM | POA: Diagnosis not present

## 2021-04-27 DIAGNOSIS — E1165 Type 2 diabetes mellitus with hyperglycemia: Secondary | ICD-10-CM | POA: Diagnosis not present

## 2021-04-27 DIAGNOSIS — E782 Mixed hyperlipidemia: Secondary | ICD-10-CM | POA: Diagnosis not present

## 2021-04-27 DIAGNOSIS — Z794 Long term (current) use of insulin: Secondary | ICD-10-CM | POA: Diagnosis not present

## 2021-04-27 LAB — POCT GLYCOSYLATED HEMOGLOBIN (HGB A1C): HbA1c, POC (controlled diabetic range): 7.2 % — AB (ref 0.0–7.0)

## 2021-04-27 NOTE — Patient Instructions (Signed)

## 2021-04-27 NOTE — Progress Notes (Signed)
04/27/2021, 12:25 PM  Endocrinology follow-up note   Subjective:    Patient ID: Laura Mullins, female    DOB: 09/11/1965.  Laura Mullins is being seen in follow-up after she was seen in consultation for management of currently uncontrolled symptomatic diabetes requested by  Avon Gully, MD.   Past Medical History:  Diagnosis Date   Arthritis    Asthma    Diabetes mellitus without complication (HCC)    GERD (gastroesophageal reflux disease)    Hyperlipidemia    Hypertension    Peptic ulcer    Premenopause menorrhagia    Trichimoniasis    Trichimoniasis 11/25/2020   11/25/20 treated    Past Surgical History:  Procedure Laterality Date   BACK SURGERY     CHOLECYSTECTOMY      Social History   Socioeconomic History   Marital status: Married    Spouse name: Not on file   Number of children: Not on file   Years of education: Not on file   Highest education level: Not on file  Occupational History   Not on file  Tobacco Use   Smoking status: Never   Smokeless tobacco: Never  Vaping Use   Vaping Use: Never used  Substance and Sexual Activity   Alcohol use: No   Drug use: No   Sexual activity: Not Currently    Birth control/protection: Post-menopausal  Other Topics Concern   Not on file  Social History Narrative   Not on file   Social Determinants of Health   Financial Resource Strain: Not on file  Food Insecurity: Not on file  Transportation Needs: Not on file  Physical Activity: Not on file  Stress: Not on file  Social Connections: Not on file    Family History  Problem Relation Age of Onset   Hypertension Mother    Hyperlipidemia Mother    Diabetes Mother    Stroke Father    Heart disease Father    Heart attack Father    Alcohol abuse Father    Cancer Sister        leukemia   Diabetes Sister    Hypertension Sister    Heart disease Sister    Alcohol abuse  Sister    Schizophrenia Sister    Cancer Brother    Diabetes Brother    Diabetes Maternal Grandmother    Cancer Maternal Grandmother    Alzheimer's disease Maternal Grandmother    Cancer Sister    Diabetes Sister    Cancer Sister    Diabetes Sister    Diabetes Sister    Diabetes Sister    Diabetes Sister    Diabetes Sister    Cancer Brother    Diabetes Brother    Diabetes Brother    Diabetes Brother    Diabetes Brother    Heart disease Brother    Diabetes Brother    Cancer Brother        colon   Diabetes Brother    Hyperlipidemia Brother    Diabetes Brother    Diabetes Brother     Outpatient Encounter Medications as of 04/27/2021  Medication Sig   albuterol (PROVENTIL HFA;VENTOLIN HFA) 108 (90 BASE) MCG/ACT inhaler Inhale 2 puffs into the lungs every 6 (six) hours as needed for wheezing or shortness of breath.   albuterol (PROVENTIL) (2.5 MG/3ML) 0.083% nebulizer solution Take 2.5 mg by nebulization every 6 (six) hours as needed for wheezing or shortness of breath.   aspirin EC 81 MG tablet Take 81 mg by mouth daily.   atorvastatin (LIPITOR) 40 MG tablet Take 1 tablet (40 mg total) by mouth daily.   gabapentin (NEURONTIN) 600 MG tablet Take 600 mg by mouth daily as needed (for neuropathy/pain).    Insulin Pen Needle (B-D ULTRAFINE III SHORT PEN) 31G X 8 MM MISC 1 each by Does not apply route as directed.   linaclotide (LINZESS) 290 MCG CAPS capsule Take 1 capsule (290 mcg total) by mouth daily before breakfast. (Patient taking differently: Take 290 mcg by mouth as needed. )   lisinopril-hydrochlorothiazide (ZESTORETIC) 20-12.5 MG per tablet Take 1 tablet by mouth daily.   loratadine (CLARITIN) 10 MG tablet Take 10 mg by mouth daily as needed for allergies.   metFORMIN (GLUCOPHAGE) 1000 MG tablet Take 1,000 mg by mouth 2 (two) times daily with a meal.   Multiple Vitamins-Calcium (ONE-A-DAY WOMENS PO) Take 1 tablet by mouth. 3 times a week   omeprazole (PRILOSEC) 40 MG capsule  Take 40 mg by mouth daily.   [DISCONTINUED] metroNIDAZOLE (FLAGYL) 500 MG tablet Take 1 tablet (500 mg total) by mouth 3 (three) times daily. (Patient not taking: Reported on 12/14/2020)   No facility-administered encounter medications on file as of 04/27/2021.    ALLERGIES: No Known Allergies  VACCINATION STATUS: Immunization History  Administered Date(s) Administered   Influenza-Unspecified 09/03/2018   Moderna Sars-Covid-2 Vaccination 11/30/2019, 12/28/2019    Diabetes She presents for her follow-up diabetic visit. She has type 2 diabetes mellitus. Onset time: She was diagnosed at approximate age of 40 years. Her disease course has been stable. There are no hypoglycemic associated symptoms. Pertinent negatives for hypoglycemia include no confusion, headaches, pallor or seizures (.1a). Pertinent negatives for diabetes include no chest pain, no fatigue, no polydipsia, no polyphagia and no polyuria. There are no hypoglycemic complications. Symptoms are stable. There are no diabetic complications. Risk factors for coronary artery disease include diabetes mellitus, dyslipidemia, hypertension and family history. Current diabetic treatments: She is currently on NovoLog 70/30 45 units twice daily, as well as Metformin 1000 g p.o. twice daily. Her weight is decreasing steadily. She is following a generally unhealthy diet. When asked about meal planning, she reported none. She has not had a previous visit with a dietitian. (She presents with controlled profile of diabetes.  Her point-of-care A1c is up slightly to 7.2%, although overall still improving from 11%.  She is only taking metformin at this time. ) An ACE inhibitor/angiotensin II receptor blocker is being taken.  Hyperlipidemia This is a chronic problem. The current episode started more than 1 year ago. The problem is uncontrolled. Exacerbating diseases include diabetes. Pertinent negatives include no chest pain, myalgias or shortness of breath.  Current antihyperlipidemic treatment includes statins. Risk factors for coronary artery disease include dyslipidemia, diabetes mellitus, hypertension, a sedentary lifestyle and family history.  Hypertension This is a chronic problem. The problem is controlled. Pertinent negatives include no chest pain, headaches, palpitations or shortness of breath. Risk factors for coronary artery disease include dyslipidemia, diabetes mellitus and family history. Past treatments include ACE inhibitors and diuretics.    Review of Systems  Constitutional:  Negative for chills, fatigue, fever and unexpected weight change.  HENT:  Negative for trouble swallowing and voice change.   Eyes:  Negative for visual disturbance.  Respiratory:  Negative for cough, shortness of breath and wheezing.   Cardiovascular:  Negative for chest pain, palpitations and leg swelling.  Gastrointestinal:  Negative for diarrhea, nausea and vomiting.  Endocrine: Negative for cold intolerance, heat intolerance, polydipsia, polyphagia and polyuria.  Musculoskeletal:  Negative for arthralgias and myalgias.  Skin:  Negative for color change, pallor, rash and wound.  Neurological:  Negative for seizures (.1a) and headaches.  Psychiatric/Behavioral:  Negative for confusion and suicidal ideas.    Objective:    Vitals with BMI 04/27/2021 12/14/2020 08/08/2020  Height 5\' 7"  5\' 7"  5\' 7"   Weight 155 lbs 3 oz 158 lbs 13 oz 170 lbs  BMI 24.3 24.87 26.62  Systolic 136 136  Diastolic 82 82 95  Pulse 92 100 99    BP 136/82   Pulse 92   Ht 5\' 7"  (1.702 m)   Wt 155 lb 3.2 oz (70.4 kg)   LMP 06/02/2016   BMI 24.31 kg/m   Wt Readings from Last 3 Encounters:  04/27/21 155 lb 3.2 oz (70.4 kg)  12/14/20 158 lb 12.8 oz (72 kg)  08/08/20 170 lb (77.1 kg)     Physical Exam Constitutional:      Appearance: She is well-developed.  HENT:     Head: Normocephalic and atraumatic.  Neck:     Thyroid: No thyromegaly.     Trachea: No tracheal  deviation.  Cardiovascular:     Rate and Rhythm: Normal rate and regular rhythm.  Pulmonary:     Effort: Pulmonary effort is normal.  Abdominal:     Tenderness: There is no abdominal tenderness. There is no guarding.  Musculoskeletal:        General: Normal range of motion.     Cervical back: Normal range of motion and neck supple.     Comments: Her foot exam is significant for diminished monofilament test bilaterally as well as diminished dorsalis pedis and posterior tibial arterial pulses.  Skin:    General: Skin is warm and dry.     Coloration: Skin is not pale.     Findings: No erythema or rash.  Neurological:     Mental Status: She is alert and oriented to person, place, and time.     Cranial Nerves: No cranial nerve deficit.     Coordination: Coordination normal.     Deep Tendon Reflexes: Reflexes are normal and symmetric.  Psychiatric:        Judgment: Judgment normal.    CMP ( most recent) CMP     Component Value Date/Time   NA 144 12/08/2020 0936   K 3.5 12/08/2020 0936   CL 101 12/08/2020 0936   CO2 25 12/08/2020 0936   GLUCOSE 79 12/08/2020 0936   GLUCOSE 86 08/12/2020 1045   BUN 7 12/08/2020 0936   CREATININE 0.74 12/08/2020 0936   CREATININE 0.69 08/12/2020 1045   CALCIUM 9.3 12/08/2020 0936   PROT 6.9 12/08/2020 0936   ALBUMIN 4.1 12/08/2020 0936   AST 19 12/08/2020 0936   ALT 15 12/08/2020 0936   ALKPHOS 82 12/08/2020 0936   BILITOT 0.6 12/08/2020 0936   GFRNONAA 99 08/12/2020 1045   GFRAA 114 08/12/2020 1045     Diabetic Labs (most recent): Lab Results  Component Value Date   HGBA1C 7.2 (A) 04/27/2021   HGBA1C 5.4 12/14/2020  HGBA1C 5.5 08/16/2020     Assessment & Plan:   1. Type 2 diabetes mellitus with hyperglycemia, with long-term current use of insulin (HCC)  - Laura Mullins has currently uncontrolled symptomatic type 2 DM since  55 years of age.  She presents with controlled profile of diabetes.  Her point-of-care A1c is up  slightly to 7.2%, although overall still improving from 11%.  She is only taking metformin at this time.   - I had a long discussion with her about the progressive nature of diabetes and the pathology behind its complications. -her diabetes is complicated by peripheral arterial disease, peripheral neuropathy and she remains at a high risk for more acute and chronic complications which include CAD, CVA, CKD, retinopathy, and neuropathy. These are all discussed in detail with her.  - I have counseled her on diet  and weight management  by adopting a carbohydrate restricted/protein rich diet. Patient is encouraged to switch to  unprocessed or minimally processed     complex starch and increased protein intake (animal or plant source), fruits, and vegetables. -  she is advised to stick to a routine mealtimes to eat 3 meals  a day and avoid unnecessary snacks ( to snack only to correct hypoglycemia).  - she acknowledges that there is a room for improvement in her food and drink choices. - Suggestion is made for her to avoid simple carbohydrates  from her diet including Cakes, Sweet Desserts, Ice Cream, Soda (diet and regular), Sweet Tea, Candies, Chips, Cookies, Store Bought Juices, Alcohol in Excess of  1-2 drinks a day, Artificial Sweeteners,  Coffee Creamer, and "Sugar-free" Products, Lemonade. This will help patient to have more stable blood glucose profile and potentially avoid unintended weight gain.   - she will continue follow-up with  Norm Salt, RDN, CDE for diabetes education.  - I have approached her with the following individualized plan to manage  her diabetes and patient agrees:   -In light of her presentation with target A1c of 7.2%, she will not need insulin treatment for now.    - she is willing and encouraged to continue to monitor blood glucose at least once a day-daily before breakfast and call clinic  for blood glucose levels less than 70 or above 200 mg /dl. - she is advised  to continue Metformin 1000 mg p.o. twice daily, therapeutically suitable for patient . -She was taken off of glipizide during her last visit.   -She is not a suitable candidate for incretin therapy, nor SGLT2 inhibitors due to her body habitus.  - Specific targets for  A1c;  LDL, HDL,  and Triglycerides were discussed with the patient.  2) Blood Pressure /Hypertension:  -Her blood pressure is controlled to target.  She has mild microalbuminuria. she is advised to continue her current medications including lisinopril/HCTZ 20/12.5 mg p.o. daily.   3) Lipids/Hyperlipidemia: Her recent lipid panel showed uncontrolled LDL at 145.  She was not taking her Lipitor, she is advised to resume Lipitor 40 mg p.o. nightly.   Side effects and precautions discussed with her.  4)  Weight/Diet:  Body mass index is 24.31 kg/m.  -   She now has steady weight after she lost 12 pounds.  She is not a candidate for major weight loss.  This is helping her diabetes and high blood pressure.  she is not  a candidate for any more major weight loss.  Exercise, and detailed carbohydrates information provided  -  detailed on discharge instructions.  5) Chronic Care/Health Maintenance:  -she  Is on ACEI/ARB and Statin medications and  is encouraged to initiate and continue to follow up with Ophthalmology, Dentist,  Podiatrist at least yearly or according to recommendations, and advised to   stay away from smoking. I have recommended yearly flu vaccine and pneumonia vaccine at least every 5 years; moderate intensity exercise for up to 150 minutes weekly; and  sleep for at least 7 hours a day.  Her screening ABI was negative for PAD in December 2021.  This study will be repeated in 2026 or sooner if needed.    - she is  advised to maintain close follow up with Avon Gully, MD for primary care needs, as well as her other providers for optimal and coordinated care.    I spent 31 minutes in the care of the patient today  including review of labs from CMP, Lipids, Thyroid Function, Hematology (current and previous including abstractions from other facilities); face-to-face time discussing  her blood glucose readings/logs, discussing hypoglycemia and hyperglycemia episodes and symptoms, medications doses, her options of short and long term treatment based on the latest standards of care / guidelines;  discussion about incorporating lifestyle medicine;  and documenting the encounter.    Please refer to Patient Instructions for Blood Glucose Monitoring and Insulin/Medications Dosing Guide"  in media tab for additional information. Please  also refer to " Patient Self Inventory" in the Media  tab for reviewed elements of pertinent patient history.  Laura Mullins participated in the discussions, expressed understanding, and voiced agreement with the above plans.  All questions were answered to her satisfaction. she is encouraged to contact clinic should she have any questions or concerns prior to her return visit.  Follow up plan: - Return in about 4 months (around 08/27/2021) for F/U with Pre-visit Labs, A1c -NV.  Marquis Lunch, MD [facility] [address] [place], [place] [address] Phone: [phone]  Fax: [phone]    04/27/2021, 12:25 PM  This note was partially dictated with voice recognition software. Similar sounding words can be transcribed inadequately or may not  be corrected upon review.

## 2021-09-07 ENCOUNTER — Ambulatory Visit: Payer: Medicare Other | Admitting: "Endocrinology

## 2021-11-18 ENCOUNTER — Other Ambulatory Visit: Payer: Self-pay | Admitting: "Endocrinology

## 2021-11-18 DIAGNOSIS — E1165 Type 2 diabetes mellitus with hyperglycemia: Secondary | ICD-10-CM

## 2022-01-07 IMAGING — MG DIGITAL SCREENING BILAT W/ TOMO W/ CAD
8 series · 8 of 24 positions shown · non-contrast
Comparison: Previous exam(s).

CLINICAL DATA: Screening.

EXAM:
DIGITAL SCREENING BILATERAL MAMMOGRAM WITH TOMO AND CAD

[R CC synth-2D]
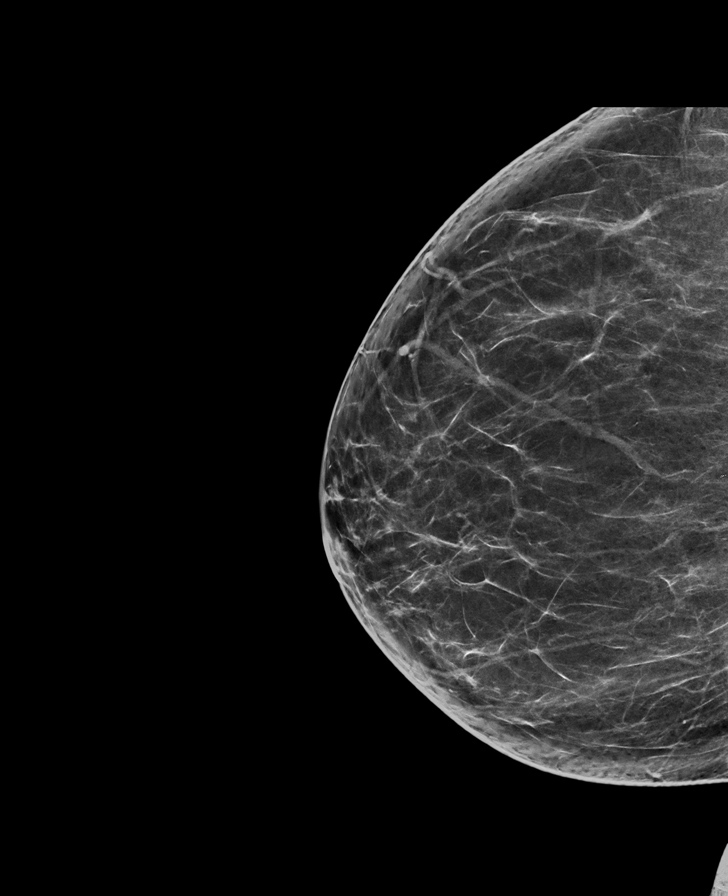

[L CC synth-2D]
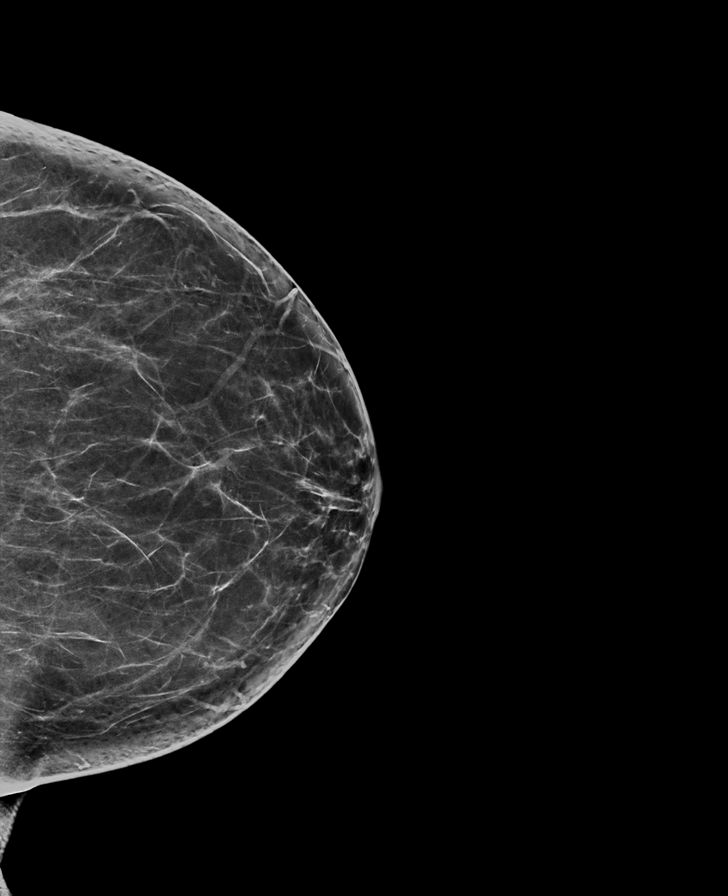

[L MLO synth-2D]
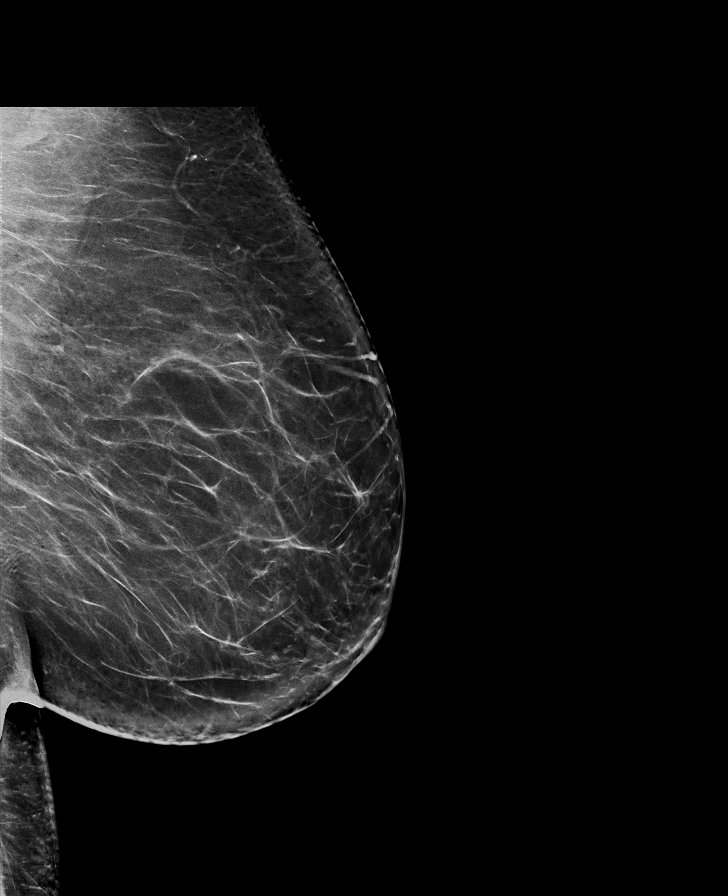

[R MLO synth-2D]
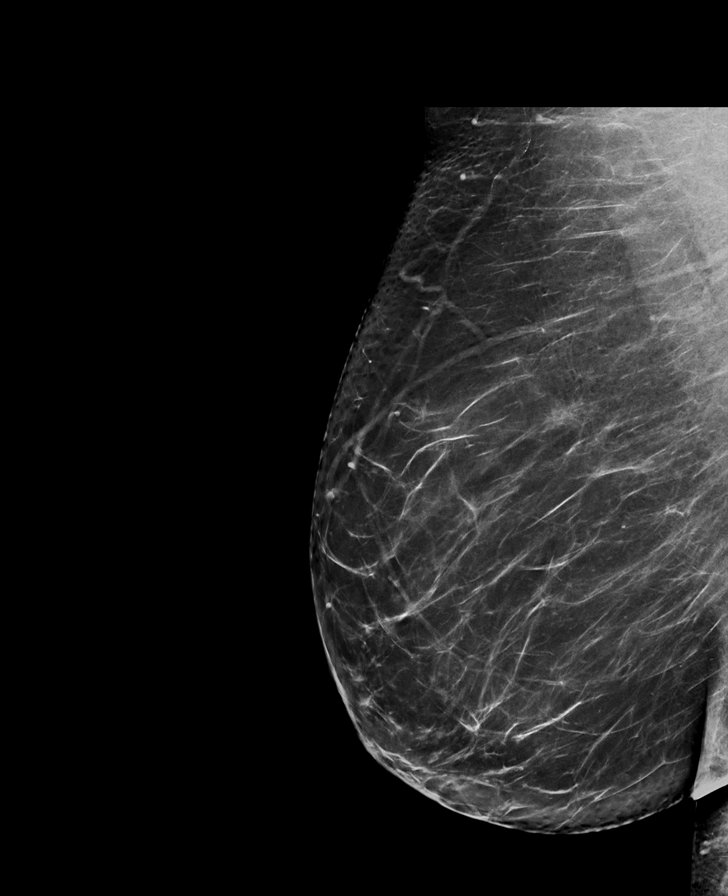

[L CC tomo · tomo slice 39/78.0]
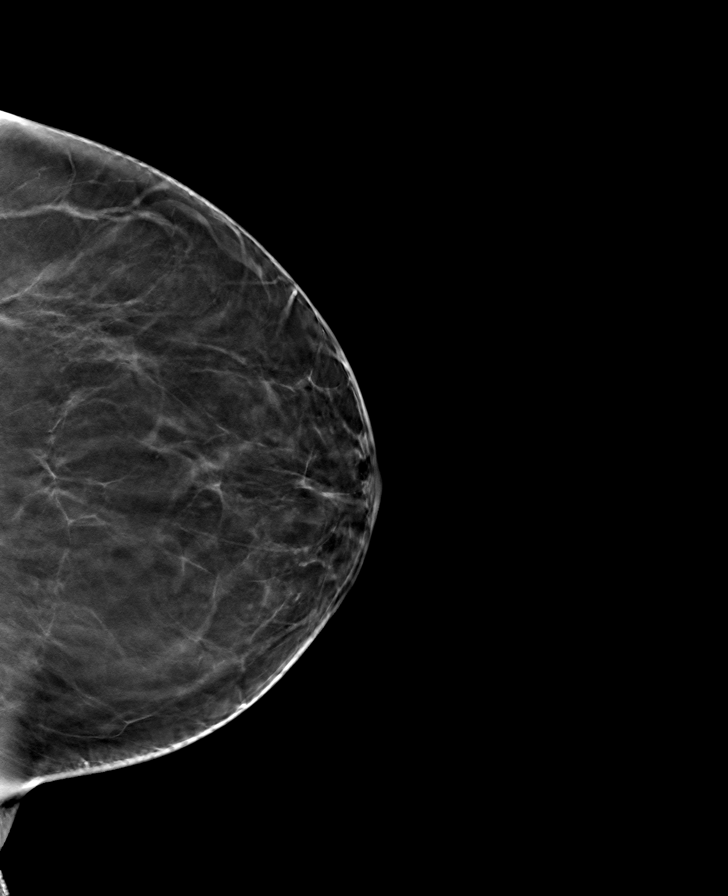

[L MLO tomo · tomo slice 47/92.0]
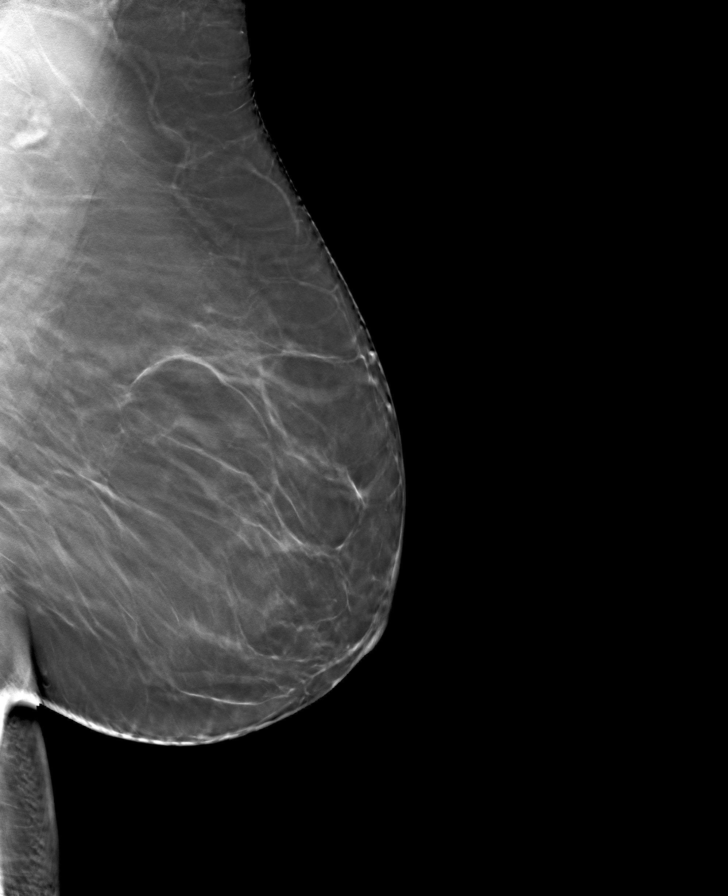

[R CC tomo · tomo slice 39/78.0]
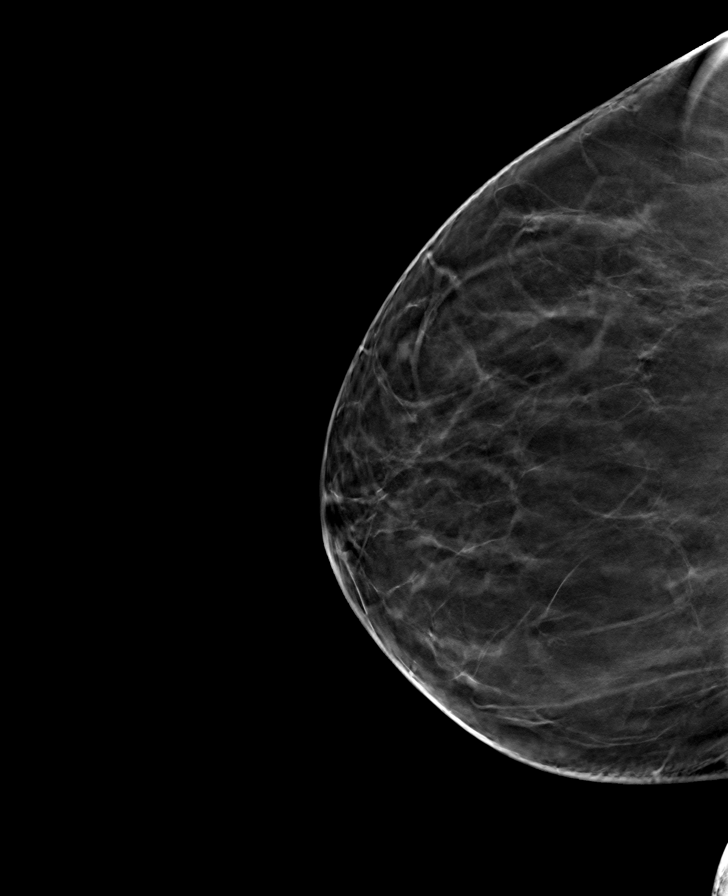

[R MLO tomo · tomo slice 47/94.0]
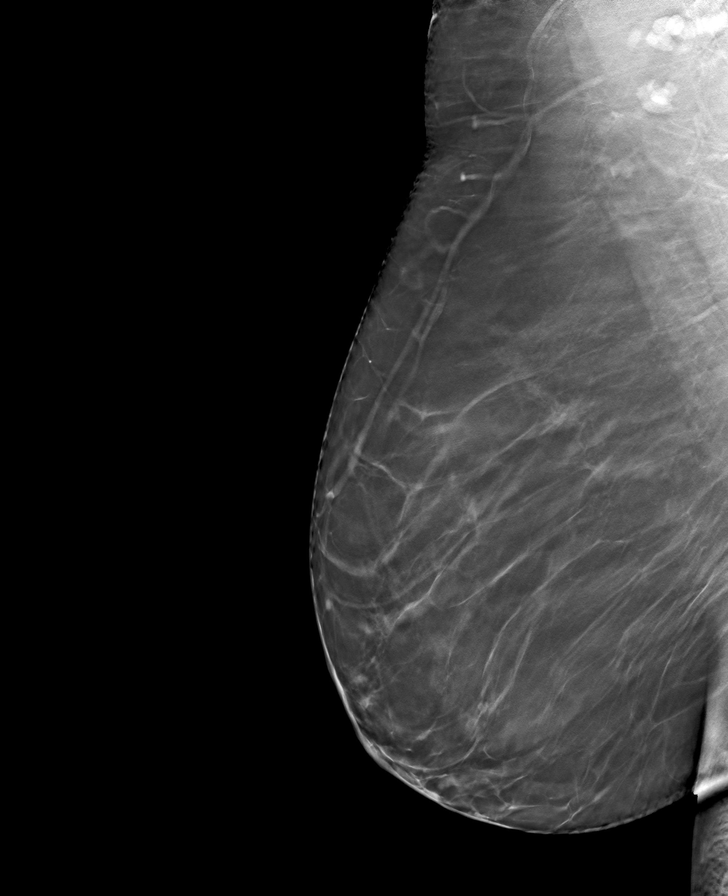

[8 of 24 positions shown; findings below may reference images not displayed]

ACR Breast Density Category b: There are scattered areas of
fibroglandular density.
FINDINGS: There are no findings suspicious for malignancy. Images were
processed with CAD.
IMPRESSION: No mammographic evidence of malignancy. A result letter of this
screening mammogram will be mailed directly to the patient.

RECOMMENDATION:
Screening mammogram in one year. (Code:[id number])

BI-RADS CATEGORY  1: Negative.

## 2022-01-24 ENCOUNTER — Other Ambulatory Visit: Payer: Self-pay | Admitting: "Endocrinology

## 2022-01-24 DIAGNOSIS — E1165 Type 2 diabetes mellitus with hyperglycemia: Secondary | ICD-10-CM

## 2022-01-26 LAB — COMPREHENSIVE METABOLIC PANEL
ALT: 6 IU/L (ref 0–32)
AST: 8 IU/L (ref 0–40)
Albumin/Globulin Ratio: 1.4 (ref 1.2–2.2)
Albumin: 4.1 g/dL (ref 3.8–4.9)
Alkaline Phosphatase: 101 IU/L (ref 44–121)
BUN/Creatinine Ratio: 12 (ref 9–23)
BUN: 11 mg/dL (ref 6–24)
Bilirubin Total: 0.4 mg/dL (ref 0.0–1.2)
CO2: 23 mmol/L (ref 20–29)
Calcium: 9.4 mg/dL (ref 8.7–10.2)
Chloride: 103 mmol/L (ref 96–106)
Creatinine, Ser: 0.93 mg/dL (ref 0.57–1.00)
Globulin, Total: 3 g/dL (ref 1.5–4.5)
Glucose: 179 mg/dL — ABNORMAL HIGH (ref 70–99)
Potassium: 4.5 mmol/L (ref 3.5–5.2)
Sodium: 142 mmol/L (ref 134–144)
Total Protein: 7.1 g/dL (ref 6.0–8.5)
eGFR: 73 mL/min/{1.73_m2} (ref >59)

## 2022-01-26 LAB — LIPID PANEL
Chol/HDL Ratio: 5.4 ratio — ABNORMAL HIGH (ref 0.0–4.4)
Cholesterol, Total: 225 mg/dL — ABNORMAL HIGH (ref 100–199)
HDL: 42 mg/dL (ref >39)
LDL Chol Calc (NIH): 163 mg/dL — ABNORMAL HIGH (ref 0–99)
Triglycerides: 113 mg/dL (ref 0–149)
VLDL Cholesterol Cal: 20 mg/dL (ref 5–40)

## 2022-01-26 LAB — TSH: TSH: 1.7 u[IU]/mL (ref 0.450–4.500)

## 2022-01-26 LAB — T4, FREE: Free T4: 1.23 ng/dL (ref 0.82–1.77)

## 2022-02-08 ENCOUNTER — Ambulatory Visit (INDEPENDENT_AMBULATORY_CARE_PROVIDER_SITE_OTHER): Payer: Managed Care, Other (non HMO) | Admitting: "Endocrinology

## 2022-02-08 ENCOUNTER — Encounter: Payer: Self-pay | Admitting: "Endocrinology

## 2022-02-08 VITALS — BP 128/80 | HR 68 | Ht 67.0 in | Wt 163.4 lb

## 2022-02-08 DIAGNOSIS — E782 Mixed hyperlipidemia: Secondary | ICD-10-CM

## 2022-02-08 DIAGNOSIS — Z794 Long term (current) use of insulin: Secondary | ICD-10-CM

## 2022-02-08 DIAGNOSIS — I10 Essential (primary) hypertension: Secondary | ICD-10-CM

## 2022-02-08 DIAGNOSIS — E1165 Type 2 diabetes mellitus with hyperglycemia: Secondary | ICD-10-CM | POA: Diagnosis not present

## 2022-02-08 LAB — POCT GLYCOSYLATED HEMOGLOBIN (HGB A1C): HbA1c, POC (controlled diabetic range): 10.2 % — AB (ref 0.0–7.0)

## 2022-02-08 MED ORDER — GLIPIZIDE ER 5 MG PO TB24
5.0000 mg | ORAL_TABLET | Freq: Every day | ORAL | 1 refills | Status: DC
Start: 2022-02-08 — End: 2022-05-14

## 2022-02-08 NOTE — Patient Instructions (Signed)

## 2022-02-08 NOTE — Progress Notes (Signed)
02/08/2022, 7:23 PM  Endocrinology follow-up note   Subjective:    Patient ID: Laura Mullins, female    DOB: 1966/01/03.  Laura Mullins is being seen in follow-up after she was seen in consultation for management of currently uncontrolled symptomatic diabetes requested by  Benetta Spar, MD.   Past Medical History:  Diagnosis Date   Arthritis    Asthma    Diabetes mellitus without complication (HCC)    GERD (gastroesophageal reflux disease)    Hyperlipidemia    Hypertension    Peptic ulcer    Premenopause menorrhagia    Trichimoniasis    Trichimoniasis 11/25/2020   11/25/20 treated    Past Surgical History:  Procedure Laterality Date   BACK SURGERY     CHOLECYSTECTOMY      Social History   Socioeconomic History   Marital status: Married    Spouse name: Not on file   Number of children: Not on file   Years of education: Not on file   Highest education level: Not on file  Occupational History   Not on file  Tobacco Use   Smoking status: Never   Smokeless tobacco: Never  Vaping Use   Vaping Use: Never used  Substance and Sexual Activity   Alcohol use: No   Drug use: No   Sexual activity: Not Currently    Birth control/protection: Post-menopausal  Other Topics Concern   Not on file  Social History Narrative   Not on file   Social Determinants of Health   Financial Resource Strain: Not on file  Food Insecurity: Not on file  Transportation Needs: Not on file  Physical Activity: Not on file  Stress: Not on file  Social Connections: Not on file    Family History  Problem Relation Age of Onset   Hypertension Mother    Hyperlipidemia Mother    Diabetes Mother    Stroke Father    Heart disease Father    Heart attack Father    Alcohol abuse Father    Cancer Sister        leukemia   Diabetes Sister    Hypertension Sister    Heart disease Sister    Alcohol  abuse Sister    Schizophrenia Sister    Cancer Brother    Diabetes Brother    Diabetes Maternal Grandmother    Cancer Maternal Grandmother    Alzheimer's disease Maternal Grandmother    Cancer Sister    Diabetes Sister    Cancer Sister    Diabetes Sister    Diabetes Sister    Diabetes Sister    Diabetes Sister    Diabetes Sister    Cancer Brother    Diabetes Brother    Diabetes Brother    Diabetes Brother    Diabetes Brother    Heart disease Brother    Diabetes Brother    Cancer Brother        colon   Diabetes Brother    Hyperlipidemia Brother    Diabetes Brother    Diabetes Brother     Outpatient Encounter Medications as of 02/08/2022  Medication Sig   glipiZIDE (GLUCOTROL XL) 5 MG 24 hr tablet Take 1 tablet (5 mg total) by mouth daily with breakfast.   albuterol (PROVENTIL HFA;VENTOLIN HFA) 108 (90 BASE) MCG/ACT inhaler Inhale 2 puffs into the lungs every 6 (six) hours as needed for wheezing or shortness of breath.   albuterol (PROVENTIL) (2.5 MG/3ML) 0.083% nebulizer solution Take 2.5 mg by nebulization every 6 (six) hours as needed for wheezing or shortness of breath.   aspirin EC 81 MG tablet Take 81 mg by mouth daily.   atorvastatin (LIPITOR) 40 MG tablet Take 1 tablet (40 mg total) by mouth daily.   gabapentin (NEURONTIN) 600 MG tablet Take 600 mg by mouth daily as needed (for neuropathy/pain).    Insulin Pen Needle (B-D ULTRAFINE III SHORT PEN) 31G X 8 MM MISC 1 each by Does not apply route as directed.   linaclotide (LINZESS) 290 MCG CAPS capsule Take 1 capsule (290 mcg total) by mouth daily before breakfast. (Patient taking differently: Take 290 mcg by mouth as needed. )   lisinopril-hydrochlorothiazide (ZESTORETIC) 20-12.5 MG per tablet Take 1 tablet by mouth daily.   loratadine (CLARITIN) 10 MG tablet Take 10 mg by mouth daily as needed for allergies.   metFORMIN (GLUCOPHAGE) 1000 MG tablet Take 1,000 mg by mouth 2 (two) times daily with a meal.   Multiple  Vitamins-Calcium (ONE-A-DAY WOMENS PO) Take 1 tablet by mouth. 3 times a week   omeprazole (PRILOSEC) 40 MG capsule Take 40 mg by mouth daily.   No facility-administered encounter medications on file as of 02/08/2022.    ALLERGIES: No Known Allergies  VACCINATION STATUS: Immunization History  Administered Date(s) Administered   Influenza-Unspecified 09/03/2018   Moderna Sars-Covid-2 Vaccination 11/30/2019, 12/28/2019    Diabetes She presents for her follow-up diabetic visit. She has type 2 diabetes mellitus. Onset time: She was diagnosed at approximate age of 40 years. Her disease course has been worsening. There are no hypoglycemic associated symptoms. Pertinent negatives for hypoglycemia include no confusion, headaches, pallor or seizures (.1a). Pertinent negatives for diabetes include no chest pain, no fatigue, no polydipsia, no polyphagia and no polyuria. There are no hypoglycemic complications. Symptoms are worsening. There are no diabetic complications. Risk factors for coronary artery disease include diabetes mellitus, dyslipidemia, hypertension and family history. Current diabetic treatments: She is currently on NovoLog 70/30 45 units twice daily, as well as Metformin 1000 g p.o. twice daily. Her weight is decreasing steadily. She is following a generally unhealthy diet. When asked about meal planning, she reported none. She has not had a previous visit with a dietitian. Her home blood glucose trend is increasing steadily. (She presents without any meter nor logs.  Her point-of-care A1c is 10.2%, increasing from 7.2%.  Previously her A1c was 11%.  She reports increased amount of stress stemming from the loss of her brother who died with coronary artery disease reportedly at age 56. ) An ACE inhibitor/angiotensin II receptor blocker is being taken.  Hyperlipidemia This is a chronic problem. The current episode started more than 1 year ago. The problem is uncontrolled. Exacerbating diseases  include diabetes. Pertinent negatives include no chest pain, myalgias or shortness of breath. Current antihyperlipidemic treatment includes statins. Risk factors for coronary artery disease include dyslipidemia, diabetes mellitus, hypertension, a sedentary lifestyle and family history.  Hypertension This is a chronic problem. The problem is controlled. Pertinent negatives include no chest pain, headaches, palpitations or shortness of breath. Risk factors for coronary artery disease include dyslipidemia, diabetes mellitus  and family history. Past treatments include ACE inhibitors and diuretics.     Review of Systems  Constitutional:  Negative for chills, fatigue, fever and unexpected weight change.  HENT:  Negative for trouble swallowing and voice change.   Eyes:  Negative for visual disturbance.  Respiratory:  Negative for cough, shortness of breath and wheezing.   Cardiovascular:  Negative for chest pain, palpitations and leg swelling.  Gastrointestinal:  Negative for diarrhea, nausea and vomiting.  Endocrine: Negative for cold intolerance, heat intolerance, polydipsia, polyphagia and polyuria.  Musculoskeletal:  Negative for arthralgias and myalgias.  Skin:  Negative for color change, pallor, rash and wound.  Neurological:  Negative for seizures (.1a) and headaches.  Psychiatric/Behavioral:  Negative for confusion and suicidal ideas.     Objective:       02/08/2022    3:11 PM 04/27/2021   10:27 AM 12/14/2020   10:13 AM  Vitals with BMI  Height 5\' 7"  5\' 7"  5\' 7"   Weight 163 lbs 6 oz 155 lbs 3 oz 158 lbs 13 oz  BMI 25.59 24.3 24.87  Systolic 128 136  Diastolic 80 82 82  Pulse 68 92 100    BP 128/80   Pulse 68   Ht 5\' 7"  (1.702 m)   Wt 163 lb 6.4 oz (74.1 kg)   LMP 06/02/2016   BMI 25.59 kg/m   Wt Readings from Last 3 Encounters:  02/08/22 163 lb 6.4 oz (74.1 kg)  04/27/21 155 lb 3.2 oz (70.4 kg)  12/14/20 158 lb 12.8 oz (72 kg)       CMP ( most recent) CMP      Component Value Date/Time   NA 142 01/25/2022 1033   K 4.5 01/25/2022 1033   CL 103 01/25/2022 1033   CO2 23 01/25/2022 1033   GLUCOSE 179 (H) 01/25/2022 1033   GLUCOSE 86 08/12/2020 1045   BUN 11 01/25/2022 1033   CREATININE 0.93 01/25/2022 1033   CREATININE 0.69 08/12/2020 1045   CALCIUM 9.4 01/25/2022 1033   PROT 7.1 01/25/2022 1033   ALBUMIN 4.1 01/25/2022 1033   AST 8 01/25/2022 1033   ALT 6 01/25/2022 1033   ALKPHOS 101 01/25/2022 1033   BILITOT 0.4 01/25/2022 1033   GFRNONAA 99 08/12/2020 1045   GFRAA 114 08/12/2020 1045     Diabetic Labs (most recent): Lab Results  Component Value Date   HGBA1C 10.2 (A) 02/08/2022   HGBA1C 7.2 (A) 04/27/2021   HGBA1C 5.4 12/14/2020   MICROALBUR 80 12/14/2020   MICROALBUR 30 08/16/2020     Assessment & Plan:   1. Type 2 diabetes mellitus with hyperglycemia, with long-term current use of insulin (HCC)  - Laura Mullins has currently uncontrolled symptomatic type 2 DM since  56 years of age.  She presents without any meter nor logs.  Her point-of-care A1c is 10.2%, increasing from 7.2%.  Previously her A1c was 11%.  She reports increased amount of stress stemming from the loss of her brother who died with coronary artery disease reportedly at age 70.   - I had a long discussion with her about the progressive nature of diabetes and the pathology behind its complications. -her diabetes is complicated by peripheral arterial disease, peripheral neuropathy and she remains at exceedingly  high risk for more acute and chronic complications which include CAD, CVA, CKD, retinopathy, and neuropathy. These are all discussed in detail with her.  - I have counseled her on diet  and weight management  by  adopting a carbohydrate restricted/protein rich diet. Patient is encouraged to switch to  unprocessed or minimally processed     complex starch and increased protein intake mostly plant source, fruits, and vegetables.  - she acknowledges that  there is a room for improvement in her food and drink choices. - Suggestion is made for her to avoid simple carbohydrates  from her diet including Cakes, Sweet Desserts, Ice Cream, Soda (diet and regular), Sweet Tea, Candies, Chips, Cookies, Store Bought Juices, Alcohol , Artificial Sweeteners,  Coffee Creamer, and "Sugar-free" Products, Lemonade. This will help patient to have more stable blood glucose profile and potentially avoid unintended weight gain.  The following Lifestyle Medicine recommendations according to American College of Lifestyle Medicine  [facility]) were discussed and and offered to patient and she  agrees to start the journey:  A. Whole Foods, Plant-Based Nutrition comprising of fruits and vegetables, plant-based proteins, whole-grain carbohydrates was discussed in detail with the patient.   A list for source of those nutrients were also provided to the patient.  Patient will use only water or unsweetened tea for hydration. B.  The need to stay away from risky substances including alcohol, smoking; obtaining 7 to 9 hours of restorative sleep, at least 150 minutes of moderate intensity exercise weekly, the importance of healthy social connections,  and stress management techniques were discussed. C.  A full color page of  Calorie density of various food groups per pound showing examples of each food groups was provided to the patient.   - she will continue follow-up with  Norm Salt, RDN, CDE for diabetes education.  - I have approached her with the following individualized plan to manage  her diabetes and patient agrees:   -In light of her presentation loss of control, she will likely need insulin treatment to achieve control of diabetes to target.  -In preparation, she is approached to start monitoring blood glucose at least twice a day-daily before breakfast and at bedtime and return in 7 days with her meter and logs.    -In the meantime, she is advised to continue metformin  1000 mg p.o. twice daily, discussed and added glipizide 5 mg XL p.o. daily at breakfast.     -She is not a suitable candidate for incretin therapy, nor SGLT2 inhibitors due to her body habitus.  - Specific targets for  A1c;  LDL, HDL,  and Triglycerides were discussed with the patient.  2) Blood Pressure /Hypertension:  -Her blood pressure is controlled to target.  She has mild microalbuminuria. she is advised to continue her current medications including lisinopril/HCTZ 20/12.5 mg p.o. daily.   3) Lipids/Hyperlipidemia: Her recent lipid panel showed uncontrolled LDL at 145.  She was not taking her Lipitor, she is advised to resume Lipitor 40 mg p.o. nightly.  Side effects and precautions discussed with her.     4)  Weight/Diet:  Body mass index is 25.59 kg/m.  -   She is not a candidate for weight loss.   This is helping her diabetes and high blood pressure.  she is not  a candidate for any more major weight loss.  Exercise, and detailed carbohydrates information provided  -  detailed on discharge instructions.  5) Chronic Care/Health Maintenance:  -she  Is on ACEI/ARB and Statin medications and  is encouraged to initiate and continue to follow up with Ophthalmology, Dentist,  Podiatrist at least yearly or according to recommendations, and advised to   stay away from smoking. I have recommended  yearly flu vaccine and pneumonia vaccine at least every 5 years; moderate intensity exercise for up to 150 minutes weekly; and  sleep for at least 7 hours a day.  Her screening ABI was negative for PAD in December 2021.  This study will be repeated in 2026 or sooner if needed.    - she is  advised to maintain close follow up with Benetta Spar, MD for primary care needs, as well as her other providers for optimal and coordinated care.  I spent 41 minutes in the care of the patient today including review of labs from CMP, Lipids, Thyroid Function, Hematology (current and previous including  abstractions from other facilities); face-to-face time discussing  her blood glucose readings/logs, discussing hypoglycemia and hyperglycemia episodes and symptoms, medications doses, her options of short and long term treatment based on the latest standards of care / guidelines;  discussion about incorporating lifestyle medicine;  and documenting the encounter.    Please refer to Patient Instructions for Blood Glucose Monitoring and Insulin/Medications Dosing Guide"  in media tab for additional information. Please  also refer to " Patient Self Inventory" in the Media  tab for reviewed elements of pertinent patient history.  Laura Mullins participated in the discussions, expressed understanding, and voiced agreement with the above plans.  All questions were answered to her satisfaction. she is encouraged to contact clinic should she have any questions or concerns prior to her return visit.   Follow up plan: - Return in about 1 week (around 02/15/2022) for F/U with Meter/CGM Megan Salon Only - no Labs.  Marquis Lunch, MD [facility] [address] [place], [place] [address] Phone: [phone]  Fax: [phone]    02/08/2022, 7:23 PM  This note was partially dictated with voice recognition software. Similar sounding words can be transcribed inadequately or may not  be corrected upon review.

## 2022-02-15 ENCOUNTER — Encounter: Payer: Self-pay | Admitting: "Endocrinology

## 2022-02-15 ENCOUNTER — Ambulatory Visit (INDEPENDENT_AMBULATORY_CARE_PROVIDER_SITE_OTHER): Payer: Managed Care, Other (non HMO) | Admitting: "Endocrinology

## 2022-02-15 VITALS — BP 128/84 | HR 92 | Ht 67.0 in | Wt 165.6 lb

## 2022-02-15 DIAGNOSIS — I10 Essential (primary) hypertension: Secondary | ICD-10-CM

## 2022-02-15 DIAGNOSIS — Z794 Long term (current) use of insulin: Secondary | ICD-10-CM | POA: Diagnosis not present

## 2022-02-15 DIAGNOSIS — E1165 Type 2 diabetes mellitus with hyperglycemia: Secondary | ICD-10-CM | POA: Diagnosis not present

## 2022-02-15 DIAGNOSIS — E782 Mixed hyperlipidemia: Secondary | ICD-10-CM | POA: Diagnosis not present

## 2022-02-15 MED ORDER — INSULIN PEN NEEDLE 32G X 4 MM MISC: 1 refills | Status: DC

## 2022-02-15 MED ORDER — TRESIBA FLEXTOUCH 100 UNIT/ML ~~LOC~~ SOPN: 20.0000 [IU] | PEN_INJECTOR | Freq: Every day | SUBCUTANEOUS | 1 refills | Status: DC

## 2022-02-15 NOTE — Patient Instructions (Signed)
                                     Advice for Weight Management  -For most of us the best way to lose weight is by diet management. Generally speaking, diet management means consuming less calories intentionally which over time brings about progressive weight loss.  This can be achieved more effectively by avoiding ultra processed carbohydrates, processed meats, unhealthy fats.    It is critically important to know your numbers: how much calorie you are consuming and how much calorie you need. More importantly, our carbohydrates sources should be unprocessed naturally occurring  complex starch food items.  It is always important to balance nutrition also by  appropriate intake of proteins (mainly plant-based), healthy fats/oils, plenty of fruits and vegetables.   -The American College of Lifestyle Medicine (ACL M) recommends nutrition derived mostly from Whole Food, Plant Predominant Sources example an apple instead of applesauce or apple pie. Eat Plenty of vegetables, Mushrooms, fruits, Legumes, Whole Grains, Nuts, seeds in lieu of processed meats, processed snacks/pastries red meat, poultry, eggs.  Use only water or unsweetened tea for hydration.  The College also recommends the need to stay away from risky substances including alcohol, smoking; obtaining 7-9 hours of restorative sleep, at least 150 minutes of moderate intensity exercise weekly, importance of healthy social connections, and being mindful of stress and seek help when it is overwhelming.    -Sticking to a routine mealtime to eat 3 meals a day and avoiding unnecessary snacks is shown to have a big role in weight control. Under normal circumstances, the only time we burn stored energy is when we are hungry, so allow  some hunger to take place- hunger means no food between appropriate meal times, only water.  It is not advisable to starve.   -It is better to avoid simple carbohydrates including:  Cakes, Sweet Desserts, Ice Cream, Soda (diet and regular), Sweet Tea, Candies, Chips, Cookies, Store Bought Juices, Alcohol in Excess of  1-2 drinks a day, Lemonade,  Artificial Sweeteners, Doughnuts, Coffee Creamers, "Sugar-free" Products, etc, etc.  This is not a complete list.....    -Consulting with certified diabetes educators is proven to provide you with the most accurate and current information on diet.  Also, you may be  interested in discussing diet options/exchanges , we can schedule a visit with Penny Crumpton, RDN, CDE for individualized nutrition education.  -Exercise: If you are able: 30 -60 minutes a day ,4 days a week, or 150 minutes of moderate intensity exercise weekly.    The longer the better if tolerated.  Combine stretch, strength, and aerobic activities.  If you were told in the past that you have high risk for cardiovascular diseases, or if you are currently symptomatic, you may seek evaluation by your heart doctor prior to initiating moderate to intense exercise programs.                                  Additional Care Considerations for Diabetes   -Diabetes  is a chronic disease.  The most important care consideration is regular follow-up with your diabetes care provider with the goal being avoiding or delaying its complications and to take advantage of advances in medications and technology.  If appropriate actions are taken early enough, type 2 diabetes can even be   reversed.  Seek information from the right source.  - Whole Food, Plant Predominant Nutrition is highly recommended: Eat Plenty of vegetables, Mushrooms, fruits, Legumes, Whole Grains, Nuts, seeds in lieu of processed meats, processed snacks/pastries red meat, poultry, eggs as recommended by American College of  Lifestyle Medicine (ACLM).  -Type 2 diabetes is known to coexist with other important comorbidities such as high blood pressure and high cholesterol.  It is critical to control not only the diabetes but  also the high blood pressure and high cholesterol to minimize and delay the risk of complications including coronary artery disease, stroke, amputations, blindness, etc.  The good news is that this diet recommendation for type 2 diabetes is also very helpful for managing high cholesterol and high blood blood pressure.  - Studies showed that people with diabetes will benefit from a class of medications known as ACE inhibitors and statins.  Unless there are specific reasons not to be on these medications, the standard of care is to consider getting one from these groups of medications at an optimal doses.  These medications are generally considered safe and proven to help protect the heart and the kidneys.    - People with diabetes are encouraged to initiate and maintain regular follow-up with eye doctors, foot doctors, dentists , and if necessary heart and kidney doctors.     - It is highly recommended that people with diabetes quit smoking or stay away from smoking, and get yearly  flu vaccine and pneumonia vaccine at least every 5 years.  See above for additional recommendations on exercise, sleep, stress management , and healthy social connections.      

## 2022-02-15 NOTE — Progress Notes (Signed)
02/15/2022, 5:06 PM  Endocrinology follow-up note   Subjective:    Patient ID: Laura Mullins, female    DOB: March 11, 1966.  Laura Mullins is being seen in follow-up after she was seen in consultation for management of currently uncontrolled symptomatic diabetes requested by  Benetta Spar, MD.   Past Medical History:  Diagnosis Date   Arthritis    Asthma    Diabetes mellitus without complication (HCC)    GERD (gastroesophageal reflux disease)    Hyperlipidemia    Hypertension    Peptic ulcer    Premenopause menorrhagia    Trichimoniasis    Trichimoniasis 11/25/2020   11/25/20 treated    Past Surgical History:  Procedure Laterality Date   BACK SURGERY     CHOLECYSTECTOMY      Social History   Socioeconomic History   Marital status: Married    Spouse name: Not on file   Number of children: Not on file   Years of education: Not on file   Highest education level: Not on file  Occupational History   Not on file  Tobacco Use   Smoking status: Never   Smokeless tobacco: Never  Vaping Use   Vaping Use: Never used  Substance and Sexual Activity   Alcohol use: No   Drug use: No   Sexual activity: Not Currently    Birth control/protection: Post-menopausal  Other Topics Concern   Not on file  Social History Narrative   Not on file   Social Determinants of Health   Financial Resource Strain: Not on file  Food Insecurity: Not on file  Transportation Needs: Not on file  Physical Activity: Not on file  Stress: Not on file  Social Connections: Not on file    Family History  Problem Relation Age of Onset   Hypertension Mother    Hyperlipidemia Mother    Diabetes Mother    Stroke Father    Heart disease Father    Heart attack Father    Alcohol abuse Father    Cancer Sister        leukemia   Diabetes Sister    Hypertension Sister    Heart disease Sister    Alcohol  abuse Sister    Schizophrenia Sister    Cancer Brother    Diabetes Brother    Diabetes Maternal Grandmother    Cancer Maternal Grandmother    Alzheimer's disease Maternal Grandmother    Cancer Sister    Diabetes Sister    Cancer Sister    Diabetes Sister    Diabetes Sister    Diabetes Sister    Diabetes Sister    Diabetes Sister    Cancer Brother    Diabetes Brother    Diabetes Brother    Diabetes Brother    Diabetes Brother    Heart disease Brother    Diabetes Brother    Cancer Brother        colon   Diabetes Brother    Hyperlipidemia Brother    Diabetes Brother    Diabetes Brother     Outpatient Encounter Medications as of 02/15/2022  Medication Sig   insulin degludec (TRESIBA FLEXTOUCH) 100 UNIT/ML FlexTouch Pen Inject 20 Units into the skin at bedtime.   Insulin Pen Needle 32G X 4 MM MISC Use to inject insulin nightly   albuterol (PROVENTIL HFA;VENTOLIN HFA) 108 (90 BASE) MCG/ACT inhaler Inhale 2 puffs into the lungs every 6 (six) hours as needed for wheezing or shortness of breath.   albuterol (PROVENTIL) (2.5 MG/3ML) 0.083% nebulizer solution Take 2.5 mg by nebulization every 6 (six) hours as needed for wheezing or shortness of breath.   aspirin EC 81 MG tablet Take 81 mg by mouth daily.   atorvastatin (LIPITOR) 40 MG tablet Take 1 tablet (40 mg total) by mouth daily.   gabapentin (NEURONTIN) 600 MG tablet Take 600 mg by mouth daily as needed (for neuropathy/pain).    glipiZIDE (GLUCOTROL XL) 5 MG 24 hr tablet Take 1 tablet (5 mg total) by mouth daily with breakfast.   linaclotide (LINZESS) 290 MCG CAPS capsule Take 1 capsule (290 mcg total) by mouth daily before breakfast. (Patient taking differently: Take 290 mcg by mouth as needed. )   lisinopril-hydrochlorothiazide (ZESTORETIC) 20-12.5 MG per tablet Take 1 tablet by mouth daily.   loratadine (CLARITIN) 10 MG tablet Take 10 mg by mouth daily as needed for allergies.   metFORMIN (GLUCOPHAGE) 1000 MG tablet Take 500  mg by mouth 2 (two) times daily with a meal.   Multiple Vitamins-Calcium (ONE-A-DAY WOMENS PO) Take 1 tablet by mouth. 3 times a week   omeprazole (PRILOSEC) 40 MG capsule Take 40 mg by mouth daily.   [DISCONTINUED] Insulin Pen Needle (B-D ULTRAFINE III SHORT PEN) 31G X 8 MM MISC 1 each by Does not apply route as directed.   No facility-administered encounter medications on file as of 02/15/2022.    ALLERGIES: No Known Allergies  VACCINATION STATUS: Immunization History  Administered Date(s) Administered   Influenza-Unspecified 09/03/2018   Moderna Sars-Covid-2 Vaccination 11/30/2019, 12/28/2019    Diabetes She presents for her follow-up diabetic visit. She has type 2 diabetes mellitus. Onset time: She was diagnosed at approximate age of 40 years. Her disease course has been worsening. There are no hypoglycemic associated symptoms. Pertinent negatives for hypoglycemia include no confusion, headaches, pallor or seizures (.1a). Associated symptoms include polydipsia and polyuria. Pertinent negatives for diabetes include no chest pain, no fatigue and no polyphagia. There are no hypoglycemic complications. Symptoms are worsening. There are no diabetic complications. Risk factors for coronary artery disease include diabetes mellitus, dyslipidemia, hypertension and family history. Current diabetic treatments: She is currently on NovoLog 70/30 45 units twice daily, as well as Metformin 1000 g p.o. twice daily. Her weight is fluctuating minimally. She is following a generally unhealthy diet. When asked about meal planning, she reported none. She has not had a previous visit with a dietitian. Her home blood glucose trend is increasing steadily. Her breakfast blood glucose range is generally >200 mg/dl. Her overall blood glucose range is >200 mg/dl. (She presents with a log and meter showing improved , but still significantly high glycemic burden , EAG of 228. Her recent POC a1c was 10.2% increasing from  7.2%.    ) An ACE inhibitor/angiotensin II receptor blocker is being taken.  Hyperlipidemia This is a chronic problem. The current episode started more than 1 year ago. The problem is uncontrolled. Exacerbating diseases include diabetes. Pertinent negatives include no chest pain, myalgias or shortness of breath. Current antihyperlipidemic treatment includes statins. Risk factors for coronary artery disease include dyslipidemia, diabetes mellitus, hypertension,  a sedentary lifestyle and family history.  Hypertension This is a chronic problem. The problem is controlled. Pertinent negatives include no chest pain, headaches, palpitations or shortness of breath. Risk factors for coronary artery disease include dyslipidemia, diabetes mellitus and family history. Past treatments include ACE inhibitors and diuretics.     Review of Systems  Constitutional:  Negative for chills, fatigue, fever and unexpected weight change.  HENT:  Negative for trouble swallowing and voice change.   Eyes:  Negative for visual disturbance.  Respiratory:  Negative for cough, shortness of breath and wheezing.   Cardiovascular:  Negative for chest pain, palpitations and leg swelling.  Gastrointestinal:  Negative for diarrhea, nausea and vomiting.  Endocrine: Positive for polydipsia and polyuria. Negative for cold intolerance, heat intolerance and polyphagia.  Musculoskeletal:  Negative for arthralgias and myalgias.  Skin:  Negative for color change, pallor, rash and wound.  Neurological:  Negative for seizures (.1a) and headaches.  Psychiatric/Behavioral:  Negative for confusion and suicidal ideas.     Objective:       02/15/2022    3:22 PM 02/08/2022    3:11 PM 04/27/2021   10:27 AM  Vitals with BMI  Height 5\' 7"  5\' 7"  5\' 7"   Weight 165 lbs 10 oz 163 lbs 6 oz 155 lbs 3 oz  BMI 25.93 25.59 24.3  Systolic 128 128  Diastolic 84 80 82  Pulse 92 68 92    BP 128/84   Pulse 92   Ht 5\' 7"  (1.702 m)   Wt 165 lb  9.6 oz (75.1 kg)   LMP 06/02/2016   BMI 25.94 kg/m   Wt Readings from Last 3 Encounters:  02/15/22 165 lb 9.6 oz (75.1 kg)  02/08/22 163 lb 6.4 oz (74.1 kg)  04/27/21 155 lb 3.2 oz (70.4 kg)       CMP ( most recent) CMP     Component Value Date/Time   NA 142 01/25/2022 1033   K 4.5 01/25/2022 1033   CL 103 01/25/2022 1033   CO2 23 01/25/2022 1033   GLUCOSE 179 (H) 01/25/2022 1033   GLUCOSE 86 08/12/2020 1045   BUN 11 01/25/2022 1033   CREATININE 0.93 01/25/2022 1033   CREATININE 0.69 08/12/2020 1045   CALCIUM 9.4 01/25/2022 1033   PROT 7.1 01/25/2022 1033   ALBUMIN 4.1 01/25/2022 1033   AST 8 01/25/2022 1033   ALT 6 01/25/2022 1033   ALKPHOS 101 01/25/2022 1033   BILITOT 0.4 01/25/2022 1033   GFRNONAA 99 08/12/2020 1045   GFRAA 114 08/12/2020 1045     Diabetic Labs (most recent): Lab Results  Component Value Date   HGBA1C 10.2 (A) 02/08/2022   HGBA1C 7.2 (A) 04/27/2021   HGBA1C 5.4 12/14/2020   MICROALBUR 80 12/14/2020   MICROALBUR 30 08/16/2020     Assessment & Plan:   1. Type 2 diabetes mellitus with hyperglycemia, with long-term current use of insulin (HCC)  - Laura Mullins has currently uncontrolled symptomatic type 2 DM since  56 years of age. She presents with a log and meter showing improved , but still significantly high glycemic burden , EAG of 228. Her recent POC a1c was 10.2% increasing from 7.2%.      - I had a long discussion with her about the progressive nature of diabetes and the pathology behind its complications. -her diabetes is complicated by peripheral arterial disease, peripheral neuropathy and she remains at exceedingly  high risk for more acute and chronic complications which include  CAD, CVA, CKD, retinopathy, and neuropathy. These are all discussed in detail with her.  - I have counseled her on diet  and weight management  by adopting a carbohydrate restricted/protein rich diet. Patient is encouraged to switch to  unprocessed or  minimally processed     complex starch and increased protein intake mostly plant source, fruits, and vegetables.  - she acknowledges that there is a room for improvement in her food and drink choices. - Suggestion is made for her to avoid simple carbohydrates  from her diet including Cakes, Sweet Desserts, Ice Cream, Soda (diet and regular), Sweet Tea, Candies, Chips, Cookies, Store Bought Juices, Alcohol , Artificial Sweeteners,  Coffee Creamer, and "Sugar-free" Products, Lemonade. This will help patient to have more stable blood glucose profile and potentially avoid unintended weight gain.  The following Lifestyle Medicine recommendations according to American College of Lifestyle Medicine  [facility]) were discussed and and offered to patient and she  agrees to start the journey:  A. Whole Foods, Plant-Based Nutrition comprising of fruits and vegetables, plant-based proteins, whole-grain carbohydrates was discussed in detail with the patient.   A list for source of those nutrients were also provided to the patient.  Patient will use only water or unsweetened tea for hydration. B.  The need to stay away from risky substances including alcohol, smoking; obtaining 7 to 9 hours of restorative sleep, at least 150 minutes of moderate intensity exercise weekly, the importance of healthy social connections,  and stress management techniques were discussed. C.  A full color page of  Calorie density of various food groups per pound showing examples of each food groups was provided to the patient.    - she will continue follow-up with  Norm Salt, RDN, CDE for diabetes education.  - I have approached her with the following individualized plan to manage  her diabetes and patient agrees:   -In light of her presentation loss of control, she will  need insulin treatment to achieve control of diabetes to target.  I discussed and started [place] 20 units qhs, she is approached to start monitoring blood glucose at  least twice a day-daily before breakfast and at bedtime .     -In the meantime, she is advised to continue metformin 500 mg p.o. twice daily, discussed and added glipizide 5 mg XL p.o. daily at breakfast.     -She is not a suitable candidate for incretin therapy, nor SGLT2 inhibitors due to her body habitus.  - Specific targets for  A1c;  LDL, HDL,  and Triglycerides were discussed with the patient.  2) Blood Pressure /Hypertension:  -Her blood pressure is controlled to target.  She has mild microalbuminuria. she is advised to continue her current medications including lisinopril/HCTZ 20/12.5 mg p.o. daily.   3) Lipids/Hyperlipidemia: Her recent lipid panel showed uncontrolled LDL at 145.  She was not taking her Lipitor, she is advised to resume Lipitor 40 mg p.o. nightly.  Side effects and precautions discussed with her.     4)  Weight/Diet:  Body mass index is 25.94 kg/m.  -   She is not a candidate for weight loss.   This is helping her diabetes and high blood pressure.  she is not  a candidate for any more major weight loss.  Exercise, and detailed carbohydrates information provided  -  detailed on discharge instructions.  5) Chronic Care/Health Maintenance:  -she  Is on ACEI/ARB and Statin medications and  is encouraged to initiate and continue  to follow up with Ophthalmology, Dentist,  Podiatrist at least yearly or according to recommendations, and advised to   stay away from smoking. I have recommended yearly flu vaccine and pneumonia vaccine at least every 5 years; moderate intensity exercise for up to 150 minutes weekly; and  sleep for at least 7 hours a day.  Her screening ABI was negative for PAD in December 2021.  This study will be repeated in 2026 or sooner if needed.    - she is  advised to maintain close follow up with Benetta SparFanta, Tesfaye Demissie, MD for primary care needs, as well as her other providers for optimal and coordinated care.    I spent 32 minutes in the care of  the patient today including review of labs from CMP, Lipids, Thyroid Function, Hematology (current and previous including abstractions from other facilities); face-to-face time discussing  her blood glucose readings/logs, discussing hypoglycemia and hyperglycemia episodes and symptoms, medications doses, her options of short and long term treatment based on the latest standards of care / guidelines;  discussion about incorporating lifestyle medicine;  and documenting the encounter.    Please refer to Patient Instructions for Blood Glucose Monitoring and Insulin/Medications Dosing Guide"  in media tab for additional information. Please  also refer to " Patient Self Inventory" in the Media  tab for reviewed elements of pertinent patient history.  Laura RivalStella K Stopka participated in the discussions, expressed understanding, and voiced agreement with the above plans.  All questions were answered to her satisfaction. she is encouraged to contact clinic should she have any questions or concerns prior to her return visit.    Follow up plan: - Return in about 3 months (around 05/18/2022) for Bring Meter/CGM Device/Logs- A1c in Office, Urine MA - [place].  Marquis LunchGebre Michah Minton, MD [facility] [address] [place], [place] [address] Phone: [phone]  Fax: [phone]    02/15/2022, 5:06 PM  This note was partially dictated with voice recognition software. Similar sounding words can be transcribed inadequately or may not  be corrected upon review.

## 2022-05-11 ENCOUNTER — Other Ambulatory Visit: Payer: Self-pay | Admitting: "Endocrinology

## 2022-05-22 ENCOUNTER — Ambulatory Visit: Payer: Medicare Other | Admitting: "Endocrinology

## 2022-06-04 ENCOUNTER — Encounter: Payer: Self-pay | Admitting: "Endocrinology

## 2022-06-04 ENCOUNTER — Ambulatory Visit (INDEPENDENT_AMBULATORY_CARE_PROVIDER_SITE_OTHER): Payer: Managed Care, Other (non HMO) | Admitting: "Endocrinology

## 2022-06-04 VITALS — BP 128/86 | HR 80 | Ht 67.0 in | Wt 170.6 lb

## 2022-06-04 DIAGNOSIS — I10 Essential (primary) hypertension: Secondary | ICD-10-CM

## 2022-06-04 DIAGNOSIS — E1165 Type 2 diabetes mellitus with hyperglycemia: Secondary | ICD-10-CM

## 2022-06-04 DIAGNOSIS — Z794 Long term (current) use of insulin: Secondary | ICD-10-CM | POA: Diagnosis not present

## 2022-06-04 DIAGNOSIS — E782 Mixed hyperlipidemia: Secondary | ICD-10-CM | POA: Diagnosis not present

## 2022-06-04 LAB — POCT GLYCOSYLATED HEMOGLOBIN (HGB A1C): HbA1c, POC (controlled diabetic range): 7.3 % — AB (ref 0.0–7.0)

## 2022-06-04 MED ORDER — TRESIBA FLEXTOUCH 100 UNIT/ML ~~LOC~~ SOPN: 10.0000 [IU] | PEN_INJECTOR | Freq: Every day | SUBCUTANEOUS | 0 refills | Status: DC

## 2022-06-04 MED ORDER — ACCU-CHEK GUIDE VI STRP: ORAL_STRIP | 2 refills | Status: DC

## 2022-06-04 MED ORDER — FREESTYLE LIBRE 2 READER DEVI: 0 refills | Status: DC

## 2022-06-04 MED ORDER — FREESTYLE LIBRE 2 SENSOR MISC: 1.0000 | 3 refills | Status: DC

## 2022-06-04 NOTE — Patient Instructions (Signed)
                                     Advice for Weight Management  -For most of us the best way to lose weight is by diet management. Generally speaking, diet management means consuming less calories intentionally which over time brings about progressive weight loss.  This can be achieved more effectively by avoiding ultra processed carbohydrates, processed meats, unhealthy fats.    It is critically important to know your numbers: how much calorie you are consuming and how much calorie you need. More importantly, our carbohydrates sources should be unprocessed naturally occurring  complex starch food items.  It is always important to balance nutrition also by  appropriate intake of proteins (mainly plant-based), healthy fats/oils, plenty of fruits and vegetables.   -The American College of Lifestyle Medicine (ACL M) recommends nutrition derived mostly from Whole Food, Plant Predominant Sources example an apple instead of applesauce or apple pie. Eat Plenty of vegetables, Mushrooms, fruits, Legumes, Whole Grains, Nuts, seeds in lieu of processed meats, processed snacks/pastries red meat, poultry, eggs.  Use only water or unsweetened tea for hydration.  The College also recommends the need to stay away from risky substances including alcohol, smoking; obtaining 7-9 hours of restorative sleep, at least 150 minutes of moderate intensity exercise weekly, importance of healthy social connections, and being mindful of stress and seek help when it is overwhelming.    -Sticking to a routine mealtime to eat 3 meals a day and avoiding unnecessary snacks is shown to have a big role in weight control. Under normal circumstances, the only time we burn stored energy is when we are hungry, so allow  some hunger to take place- hunger means no food between appropriate meal times, only water.  It is not advisable to starve.   -It is better to avoid simple carbohydrates including:  Cakes, Sweet Desserts, Ice Cream, Soda (diet and regular), Sweet Tea, Candies, Chips, Cookies, Store Bought Juices, Alcohol in Excess of  1-2 drinks a day, Lemonade,  Artificial Sweeteners, Doughnuts, Coffee Creamers, "Sugar-free" Products, etc, etc.  This is not a complete list.....    -Consulting with certified diabetes educators is proven to provide you with the most accurate and current information on diet.  Also, you may be  interested in discussing diet options/exchanges , we can schedule a visit with Laura Mullins, RDN, CDE for individualized nutrition education.  -Exercise: If you are able: 30 -60 minutes a day ,4 days a week, or 150 minutes of moderate intensity exercise weekly.    The longer the better if tolerated.  Combine stretch, strength, and aerobic activities.  If you were told in the past that you have high risk for cardiovascular diseases, or if you are currently symptomatic, you may seek evaluation by your heart doctor prior to initiating moderate to intense exercise programs.                                  Additional Care Considerations for Diabetes/Prediabetes   -Diabetes  is a chronic disease.  The most important care consideration is regular follow-up with your diabetes care provider with the goal being avoiding or delaying its complications and to take advantage of advances in medications and technology.  If appropriate actions are taken early enough, type 2 diabetes can even be   reversed.  Seek information from the right source.  - Whole Food, Plant Predominant Nutrition is highly recommended: Eat Plenty of vegetables, Mushrooms, fruits, Legumes, Whole Grains, Nuts, seeds in lieu of processed meats, processed snacks/pastries red meat, poultry, eggs as recommended by American College of  Lifestyle Medicine (ACLM).  -Type 2 diabetes is known to coexist with other important comorbidities such as high blood pressure and high cholesterol.  It is critical to control not only the  diabetes but also the high blood pressure and high cholesterol to minimize and delay the risk of complications including coronary artery disease, stroke, amputations, blindness, etc.  The good news is that this diet recommendation for type 2 diabetes is also very helpful for managing high cholesterol and high blood blood pressure.  - Studies showed that people with diabetes will benefit from a class of medications known as ACE inhibitors and statins.  Unless there are specific reasons not to be on these medications, the standard of care is to consider getting one from these groups of medications at an optimal doses.  These medications are generally considered safe and proven to help protect the heart and the kidneys.    - People with diabetes are encouraged to initiate and maintain regular follow-up with eye doctors, foot doctors, dentists , and if necessary heart and kidney doctors.     - It is highly recommended that people with diabetes quit smoking or stay away from smoking, and get yearly  flu vaccine and pneumonia vaccine at least every 5 years.  See above for additional recommendations on exercise, sleep, stress management , and healthy social connections.      

## 2022-06-04 NOTE — Progress Notes (Signed)
06/04/2022, 12:52 PM  Endocrinology follow-up note   Subjective:    Patient ID: Laura Mullins, female    DOB: May 19, 1966.  Laura RivalStella K Mullins is being seen in follow-up after she was seen in consultation for management of currently uncontrolled symptomatic diabetes requested by  Benetta SparFanta, Tesfaye Demissie, MD.   Past Medical History:  Diagnosis Date   Arthritis    Asthma    Diabetes mellitus without complication (HCC)    GERD (gastroesophageal reflux disease)    Hyperlipidemia    Hypertension    Peptic ulcer    Premenopause menorrhagia    Trichimoniasis    Trichimoniasis 11/25/2020   11/25/20 treated    Past Surgical History:  Procedure Laterality Date   BACK SURGERY     CHOLECYSTECTOMY      Social History   Socioeconomic History   Marital status: Married    Spouse name: Not on file   Number of children: Not on file   Years of education: Not on file   Highest education level: Not on file  Occupational History   Not on file  Tobacco Use   Smoking status: Never   Smokeless tobacco: Never  Vaping Use   Vaping Use: Never used  Substance and Sexual Activity   Alcohol use: No   Drug use: No   Sexual activity: Not Currently    Birth control/protection: Post-menopausal  Other Topics Concern   Not on file  Social History Narrative   Not on file   Social Determinants of Health   Financial Resource Strain: Not on file  Food Insecurity: Not on file  Transportation Needs: Not on file  Physical Activity: Not on file  Stress: Not on file  Social Connections: Not on file    Family History  Problem Relation Age of Onset   Hypertension Mother    Hyperlipidemia Mother    Diabetes Mother    Stroke Father    Heart disease Father    Heart attack Father    Alcohol abuse Father    Cancer Sister        leukemia   Diabetes Sister    Hypertension Sister    Heart disease Sister     Alcohol abuse Sister    Schizophrenia Sister    Cancer Brother    Diabetes Brother    Diabetes Maternal Grandmother    Cancer Maternal Grandmother    Alzheimer's disease Maternal Grandmother    Cancer Sister    Diabetes Sister    Cancer Sister    Diabetes Sister    Diabetes Sister    Diabetes Sister    Diabetes Sister    Diabetes Sister    Cancer Brother    Diabetes Brother    Diabetes Brother    Diabetes Brother    Diabetes Brother    Heart disease Brother    Diabetes Brother    Cancer Brother        colon   Diabetes Brother    Hyperlipidemia Brother    Diabetes Brother    Diabetes Brother     Outpatient Encounter Medications as of 06/04/2022  Medication Sig   Continuous Blood Gluc Receiver (FREESTYLE LIBRE 2 READER) DEVI As directed   Continuous Blood Gluc Sensor (FREESTYLE LIBRE 2 SENSOR) MISC 1 Piece by Does not apply route every 14 (fourteen) days.   glucose blood (ACCU-CHEK GUIDE) test strip Use as instructed   albuterol (PROVENTIL HFA;VENTOLIN HFA) 108 (90 BASE) MCG/ACT inhaler Inhale 2 puffs into the lungs every 6 (six) hours as needed for wheezing or shortness of breath.   albuterol (PROVENTIL) (2.5 MG/3ML) 0.083% nebulizer solution Take 2.5 mg by nebulization every 6 (six) hours as needed for wheezing or shortness of breath.   aspirin EC 81 MG tablet Take 81 mg by mouth daily.   atorvastatin (LIPITOR) 40 MG tablet Take 1 tablet (40 mg total) by mouth daily.   gabapentin (NEURONTIN) 600 MG tablet Take 600 mg by mouth daily as needed (for neuropathy/pain).    glipiZIDE (GLUCOTROL XL) 5 MG 24 hr tablet TAKE 1 TABLET BY MOUTH EVERY DAY WITH BREAKFAST   insulin degludec (TRESIBA FLEXTOUCH) 100 UNIT/ML FlexTouch Pen Inject 10 Units into the skin at bedtime.   Insulin Pen Needle 32G X 4 MM MISC Use to inject insulin nightly   linaclotide (LINZESS) 290 MCG CAPS capsule Take 1 capsule (290 mcg total) by mouth daily before breakfast. (Patient taking differently: Take 290 mcg  by mouth as needed. )   lisinopril-hydrochlorothiazide (ZESTORETIC) 20-12.5 MG per tablet Take 1 tablet by mouth daily.   loratadine (CLARITIN) 10 MG tablet Take 10 mg by mouth daily as needed for allergies.   metFORMIN (GLUCOPHAGE) 1000 MG tablet Take 500 mg by mouth 2 (two) times daily with a meal.   Multiple Vitamins-Calcium (ONE-A-DAY WOMENS PO) Take 1 tablet by mouth. 3 times a week   omeprazole (PRILOSEC) 40 MG capsule Take 40 mg by mouth daily.   [DISCONTINUED] insulin degludec (TRESIBA FLEXTOUCH) 100 UNIT/ML FlexTouch Pen Inject 20 Units into the skin at bedtime. (Patient not taking: Reported on 06/04/2022)   No facility-administered encounter medications on file as of 06/04/2022.    ALLERGIES: No Known Allergies  VACCINATION STATUS: Immunization History  Administered Date(s) Administered   Influenza-Unspecified 09/03/2018   Moderna Sars-Covid-2 Vaccination 11/30/2019, 12/28/2019    Diabetes She presents for her follow-up diabetic visit. She has type 2 diabetes mellitus. Onset time: She was diagnosed at approximate age of 40 years. Her disease course has been worsening. There are no hypoglycemic associated symptoms. Pertinent negatives for hypoglycemia include no confusion, headaches, pallor or seizures (.1a). Associated symptoms include polydipsia and polyuria. Pertinent negatives for diabetes include no chest pain, no fatigue and no polyphagia. There are no hypoglycemic complications. Symptoms are worsening. There are no diabetic complications. Risk factors for coronary artery disease include diabetes mellitus, dyslipidemia, hypertension and family history. Current diabetic treatments: She is currently on NovoLog 70/30 45 units twice daily, as well as Metformin 1000 g p.o. twice daily. Her weight is increasing steadily (She gained 5 pounds which is a good development for her.). She is following a generally unhealthy diet. When asked about meal planning, she reported none. She has not  had a previous visit with a dietitian. Her home blood glucose trend is decreasing steadily. Her breakfast blood glucose range is generally 130-140 mg/dl. Her bedtime blood glucose range is generally 140-180 mg/dl. Her overall blood glucose range is 140-180 mg/dl. (She presents with continued improvement in her glycemic profile.  Her point-of-care A1c is 7.3%, improving from 10.2%.  She did not document hypoglycemia.    ) An  ACE inhibitor/angiotensin II receptor blocker is being taken.  Hyperlipidemia This is a chronic problem. The current episode started more than 1 year ago. The problem is uncontrolled. Exacerbating diseases include diabetes. Pertinent negatives include no chest pain, myalgias or shortness of breath. Current antihyperlipidemic treatment includes statins. Risk factors for coronary artery disease include dyslipidemia, diabetes mellitus, hypertension, a sedentary lifestyle and family history.  Hypertension This is a chronic problem. The problem is controlled. Pertinent negatives include no chest pain, headaches, palpitations or shortness of breath. Risk factors for coronary artery disease include dyslipidemia, diabetes mellitus and family history. Past treatments include ACE inhibitors and diuretics.     Review of Systems  Constitutional:  Negative for chills, fatigue, fever and unexpected weight change.  HENT:  Negative for trouble swallowing and voice change.   Eyes:  Negative for visual disturbance.  Respiratory:  Negative for cough, shortness of breath and wheezing.   Cardiovascular:  Negative for chest pain, palpitations and leg swelling.  Gastrointestinal:  Negative for diarrhea, nausea and vomiting.  Endocrine: Positive for polydipsia and polyuria. Negative for cold intolerance, heat intolerance and polyphagia.  Musculoskeletal:  Negative for arthralgias and myalgias.  Skin:  Negative for color change, pallor, rash and wound.  Neurological:  Negative for seizures (.1a) and  headaches.  Psychiatric/Behavioral:  Negative for confusion and suicidal ideas.     Objective:       06/04/2022   10:16 AM 02/15/2022    3:22 PM 02/08/2022    3:11 PM  Vitals with BMI  Height 5\' 7"  5\' 7"  5\' 7"   Weight 170 lbs 10 oz 165 lbs 10 oz 163 lbs 6 oz  BMI 26.71 25.93 25.59  Systolic 128 128  Diastolic 86 84 80  Pulse 80 92 68    BP 128/86   Pulse 80   Ht 5\' 7"  (1.702 m)   Wt 170 lb 9.6 oz (77.4 kg)   LMP 06/02/2016   BMI 26.72 kg/m   Wt Readings from Last 3 Encounters:  06/04/22 170 lb 9.6 oz (77.4 kg)  02/15/22 165 lb 9.6 oz (75.1 kg)  02/08/22 163 lb 6.4 oz (74.1 kg)       CMP ( most recent) CMP     Component Value Date/Time   NA 142 01/25/2022 1033   K 4.5 01/25/2022 1033   CL 103 01/25/2022 1033   CO2 23 01/25/2022 1033   GLUCOSE 179 (H) 01/25/2022 1033   GLUCOSE 86 08/12/2020 1045   BUN 11 01/25/2022 1033   CREATININE 0.93 01/25/2022 1033   CREATININE 0.69 08/12/2020 1045   CALCIUM 9.4 01/25/2022 1033   PROT 7.1 01/25/2022 1033   ALBUMIN 4.1 01/25/2022 1033   AST 8 01/25/2022 1033   ALT 6 01/25/2022 1033   ALKPHOS 101 01/25/2022 1033   BILITOT 0.4 01/25/2022 1033   GFRNONAA 99 08/12/2020 1045   GFRAA 114 08/12/2020 1045     Diabetic Labs (most recent): Lab Results  Component Value Date   HGBA1C 7.3 (A) 06/04/2022   HGBA1C 10.2 (A) 02/08/2022   HGBA1C 7.2 (A) 04/27/2021   MICROALBUR 80 12/14/2020   MICROALBUR 30 08/16/2020   Lipid Panel     Component Value Date/Time   CHOL 225 (H) 01/25/2022 1033   TRIG 113 01/25/2022 1033   HDL 42 01/25/2022 1033   CHOLHDL 5.4 (H) 01/25/2022 1033   CHOLHDL 5.8 (H) 08/12/2020 1045   LDLCALC 163 (H) 01/25/2022 1033   LDLCALC 173 (H) 08/12/2020 1045   LABVLDL  20 01/25/2022 1033     Assessment & Plan:   1. Type 2 diabetes mellitus with hyperglycemia, with long-term current use of insulin (HCC)  - CAMRYN LAMPSON has currently uncontrolled symptomatic type 2 DM since  56 years of  age.  She presents with continued improvement in her glycemic profile.  Her point-of-care A1c is 7.3%, improving from 10.2%.  She did not document hypoglycemia.      - I had a long discussion with her about the progressive nature of diabetes and the pathology behind its complications. -her diabetes is complicated by peripheral arterial disease, peripheral neuropathy and she remains at exceedingly  high risk for more acute and chronic complications which include CAD, CVA, CKD, retinopathy, and neuropathy. These are all discussed in detail with her.  - I have counseled her on diet  and weight management  by adopting a carbohydrate restricted/protein rich diet. Patient is encouraged to switch to  unprocessed or minimally processed     complex starch and increased protein intake mostly plant source, fruits, and vegetables.  - she acknowledges that there is a room for improvement in her food and drink choices. - Suggestion is made for her to avoid simple carbohydrates  from her diet including Cakes, Sweet Desserts, Ice Cream, Soda (diet and regular), Sweet Tea, Candies, Chips, Cookies, Store Bought Juices, Alcohol , Artificial Sweeteners,  Coffee Creamer, and "Sugar-free" Products, Lemonade. This will help patient to have more stable blood glucose profile and potentially avoid unintended weight gain.  The following Lifestyle Medicine recommendations according to American College of Lifestyle Medicine  [facility]) were discussed and and offered to patient and she  agrees to start the journey:  A. Whole Foods, Plant-Based Nutrition comprising of fruits and vegetables, plant-based proteins, whole-grain carbohydrates was discussed in detail with the patient.   A list for source of those nutrients were also provided to the patient.  Patient will use only water or unsweetened tea for hydration. B.  The need to stay away from risky substances including alcohol, smoking; obtaining 7 to 9 hours of restorative sleep, at  least 150 minutes of moderate intensity exercise weekly, the importance of healthy social connections,  and stress management techniques were discussed. C.  A full color page of  Calorie density of various food groups per pound showing examples of each food groups was provided to the patient.    - she will continue follow-up with  Norm Salt, RDN, CDE for diabetes education.  - I have approached her with the following individualized plan to manage  her diabetes and patient agrees:   -In light of her presentation with near target glycemic profile, she is allowed to lower her Evaristo Bury to 10 units nightly, associated with monitoring of blood glucose twice a day-daily before breakfast and at bedtime. - This patient would benefit from a CGM device I discussed and prescribed the freestyle libre device for her.     -In the meantime, she is advised to continue metformin 500 mg p.o. twice daily, discussed and added glipizide 5 mg XL p.o. daily at breakfast.     -She is not a suitable candidate for incretin therapy, nor SGLT2 inhibitors due to her body habitus.  - Specific targets for  A1c;  LDL, HDL,  and Triglycerides were discussed with the patient.  2) Blood Pressure /Hypertension:  -She presents with controlled blood pressure.  She has mild microalbuminuria. she is advised to continue her current medications including lisinopril/HCTZ 20/12.5 mg p.o. daily.  3) Lipids/Hyperlipidemia: Her recent lipid panel showed uncontrolled LDL at 163.  She was advised to resume her Lipitor 40 mg p.o. nightly.  She is also advised to adopt whole food plant-based diet which she is considering.   Side effects and precautions discussed with her.     4)  Weight/Diet:  Body mass index is 26.72 kg/m.  -She is reversing her catabolic phase, presents with 5 pounds of weight gain.  This is a good development for her.  She is not a candidate for weight loss.   This is helping her diabetes and high blood pressure.   she is not  a candidate for any more major weight loss.  Exercise, and detailed carbohydrates information provided  -  detailed on discharge instructions.  5) Chronic Care/Health Maintenance:  -she  Is on ACEI/ARB and Statin medications and  is encouraged to initiate and continue to follow up with Ophthalmology, Dentist,  Podiatrist at least yearly or according to recommendations, and advised to   stay away from smoking. I have recommended yearly flu vaccine and pneumonia vaccine at least every 5 years; moderate intensity exercise for up to 150 minutes weekly; and  sleep for at least 7 hours a day.  Her screening ABI was negative for PAD in December 2021.  This study will be repeated in 2026 or sooner if needed.    - she is  advised to maintain close follow up with Carrolyn Meiers, MD for primary care needs, as well as her other providers for optimal and coordinated care.   I spent 41 minutes in the care of the patient today including review of labs from [place], Lipids, Thyroid Function, Hematology (current and previous including abstractions from other facilities); face-to-face time discussing  her blood glucose readings/logs, discussing hypoglycemia and hyperglycemia episodes and symptoms, medications doses, her options of short and long term treatment based on the latest standards of care / guidelines;  discussion about incorporating lifestyle medicine;  and documenting the encounter. Risk reduction counseling performed per USPSTF guidelines to reduce cardiovascular risk factors.     Please refer to Patient Instructions for Blood Glucose Monitoring and Insulin/Medications Dosing Guide"  in media tab for additional information. Please  also refer to " Patient Self Inventory" in the Media  tab for reviewed elements of pertinent patient history.  Cleatis Polka participated in the discussions, expressed understanding, and voiced agreement with the above plans.  All questions were answered to her  satisfaction. she is encouraged to contact clinic should she have any questions or concerns prior to her return visit.   Follow up plan: - Return in about 3 months (around 09/04/2022) for F/U with Pre-visit Labs, Meter/CGM/Logs, A1c here.  Glade Lloyd, MD [facility] [address] [place], [place] [address] Phone: [phone]  Fax: [phone]    06/04/2022, 12:52 PM  This note was partially dictated with voice recognition software. Similar sounding words can be transcribed inadequately or may not  be corrected upon review.

## 2022-06-05 ENCOUNTER — Telehealth: Payer: Self-pay

## 2022-06-05 NOTE — Telephone Encounter (Signed)
Order for [place] 2 CGM device system sent to [facility].

## 2022-09-04 ENCOUNTER — Ambulatory Visit: Payer: Medicare Other | Admitting: "Endocrinology

## 2022-09-25 ENCOUNTER — Ambulatory Visit: Payer: Medicare Other | Admitting: "Endocrinology

## 2023-01-27 ENCOUNTER — Other Ambulatory Visit: Payer: Self-pay | Admitting: "Endocrinology

## 2023-02-05 ENCOUNTER — Telehealth: Payer: Self-pay | Admitting: "Endocrinology

## 2023-02-05 DIAGNOSIS — R5383 Other fatigue: Secondary | ICD-10-CM

## 2023-02-05 DIAGNOSIS — E1165 Type 2 diabetes mellitus with hyperglycemia: Secondary | ICD-10-CM

## 2023-02-05 DIAGNOSIS — E782 Mixed hyperlipidemia: Secondary | ICD-10-CM

## 2023-02-05 NOTE — Telephone Encounter (Signed)
Can you update these labs?

## 2023-02-05 NOTE — Telephone Encounter (Signed)
Order updated

## 2023-03-14 ENCOUNTER — Ambulatory Visit: Payer: Managed Care, Other (non HMO) | Admitting: "Endocrinology

## 2023-04-18 DIAGNOSIS — L03211 Cellulitis of face: Secondary | ICD-10-CM | POA: Diagnosis not present

## 2023-04-18 DIAGNOSIS — R220 Localized swelling, mass and lump, head: Secondary | ICD-10-CM | POA: Diagnosis not present

## 2023-04-18 DIAGNOSIS — R29810 Facial weakness: Secondary | ICD-10-CM | POA: Diagnosis not present

## 2023-04-18 DIAGNOSIS — E1140 Type 2 diabetes mellitus with diabetic neuropathy, unspecified: Secondary | ICD-10-CM | POA: Diagnosis not present

## 2023-04-18 DIAGNOSIS — Z794 Long term (current) use of insulin: Secondary | ICD-10-CM | POA: Diagnosis not present

## 2023-04-18 DIAGNOSIS — Z79899 Other long term (current) drug therapy: Secondary | ICD-10-CM | POA: Diagnosis not present

## 2023-04-18 DIAGNOSIS — Z7902 Long term (current) use of antithrombotics/antiplatelets: Secondary | ICD-10-CM | POA: Diagnosis not present

## 2023-04-18 DIAGNOSIS — Z7982 Long term (current) use of aspirin: Secondary | ICD-10-CM | POA: Diagnosis not present

## 2023-04-18 DIAGNOSIS — R519 Headache, unspecified: Secondary | ICD-10-CM | POA: Diagnosis not present

## 2023-04-18 DIAGNOSIS — R600 Localized edema: Secondary | ICD-10-CM | POA: Diagnosis not present

## 2023-04-18 DIAGNOSIS — R202 Paresthesia of skin: Secondary | ICD-10-CM | POA: Diagnosis not present

## 2023-04-18 DIAGNOSIS — E663 Overweight: Secondary | ICD-10-CM | POA: Diagnosis not present

## 2023-04-18 DIAGNOSIS — Z6826 Body mass index (BMI) 26.0-26.9, adult: Secondary | ICD-10-CM | POA: Diagnosis not present

## 2023-04-18 DIAGNOSIS — R030 Elevated blood-pressure reading, without diagnosis of hypertension: Secondary | ICD-10-CM | POA: Diagnosis not present

## 2023-04-18 DIAGNOSIS — I10 Essential (primary) hypertension: Secondary | ICD-10-CM | POA: Diagnosis not present

## 2023-04-18 DIAGNOSIS — R201 Hypoesthesia of skin: Secondary | ICD-10-CM | POA: Diagnosis not present

## 2023-04-22 ENCOUNTER — Encounter (HOSPITAL_COMMUNITY): Payer: Self-pay | Admitting: *Deleted

## 2023-04-22 ENCOUNTER — Other Ambulatory Visit: Payer: Self-pay

## 2023-04-22 ENCOUNTER — Emergency Department (HOSPITAL_COMMUNITY)
Admission: EM | Admit: 2023-04-22 | Discharge: 2023-04-22 | Disposition: A | Discharge status: Home / Self Care | Payer: Managed Care, Other (non HMO) | Attending: Emergency Medicine | Admitting: Emergency Medicine

## 2023-04-22 DIAGNOSIS — E1165 Type 2 diabetes mellitus with hyperglycemia: Secondary | ICD-10-CM | POA: Diagnosis not present

## 2023-04-22 DIAGNOSIS — Z79899 Other long term (current) drug therapy: Secondary | ICD-10-CM | POA: Diagnosis not present

## 2023-04-22 DIAGNOSIS — Z7984 Long term (current) use of oral hypoglycemic drugs: Secondary | ICD-10-CM | POA: Diagnosis not present

## 2023-04-22 DIAGNOSIS — Z794 Long term (current) use of insulin: Secondary | ICD-10-CM | POA: Diagnosis not present

## 2023-04-22 DIAGNOSIS — R220 Localized swelling, mass and lump, head: Secondary | ICD-10-CM | POA: Diagnosis not present

## 2023-04-22 DIAGNOSIS — R000 Tachycardia, unspecified: Secondary | ICD-10-CM | POA: Insufficient documentation

## 2023-04-22 DIAGNOSIS — K0520 Aggressive periodontitis, unspecified: Secondary | ICD-10-CM | POA: Insufficient documentation

## 2023-04-22 DIAGNOSIS — Z7982 Long term (current) use of aspirin: Secondary | ICD-10-CM | POA: Diagnosis not present

## 2023-04-22 DIAGNOSIS — K047 Periapical abscess without sinus: Secondary | ICD-10-CM | POA: Diagnosis not present

## 2023-04-22 LAB — CBC WITH DIFFERENTIAL/PLATELET
Abs Immature Granulocytes: 0.08 10*3/uL — ABNORMAL HIGH (ref 0.00–0.07)
Basophils Absolute: 0.1 10*3/uL (ref 0.0–0.1)
Basophils Relative: 1 %
Eosinophils Absolute: 0 10*3/uL (ref 0.0–0.5)
Eosinophils Relative: 0 %
HCT: 36.1 % (ref 36.0–46.0)
Hemoglobin: 11.7 g/dL — ABNORMAL LOW (ref 12.0–15.0)
Immature Granulocytes: 1 %
Lymphocytes Relative: 18 %
Lymphs Abs: 1.8 10*3/uL (ref 0.7–4.0)
MCH: 29.3 pg (ref 26.0–34.0)
MCHC: 32.4 g/dL (ref 30.0–36.0)
MCV: 90.5 fL (ref 80.0–100.0)
Monocytes Absolute: 0.7 10*3/uL (ref 0.1–1.0)
Monocytes Relative: 7 %
Neutro Abs: 7.2 10*3/uL (ref 1.7–7.7)
Neutrophils Relative %: 73 %
Platelets: 337 10*3/uL (ref 150–400)
RBC: 3.99 MIL/uL (ref 3.87–5.11)
RDW: 11.9 % (ref 11.5–15.5)
WBC: 10 10*3/uL (ref 4.0–10.5)
nRBC: 0 % (ref 0.0–0.2)

## 2023-04-22 LAB — BASIC METABOLIC PANEL
Anion gap: 10 (ref 5–15)
BUN: 10 mg/dL (ref 6–20)
CO2: 26 mmol/L (ref 22–32)
Calcium: 9.1 mg/dL (ref 8.9–10.3)
Chloride: 99 mmol/L (ref 98–111)
Creatinine, Ser: 0.76 mg/dL (ref 0.44–1.00)
GFR, Estimated: >60 mL/min (ref >60)
Glucose, Bld: 345 mg/dL — ABNORMAL HIGH (ref 70–99)
Potassium: 4.1 mmol/L (ref 3.5–5.1)
Sodium: 135 mmol/L (ref 135–145)

## 2023-04-22 MED ORDER — HYDROCODONE-ACETAMINOPHEN 5-325 MG PO TABS
1.0000 | ORAL_TABLET | Freq: Once | ORAL | Status: AC
  Administered 2023-04-22: 1 via ORAL
  Filled 2023-04-22: qty 1

## 2023-04-22 MED ORDER — AMOXICILLIN-POT CLAVULANATE 875-125 MG PO TABS: 1.0000 | ORAL_TABLET | Freq: Two times a day (BID) | ORAL | 0 refills | Status: AC

## 2023-04-22 MED ORDER — LIDOCAINE HCL (PF) 1 % IJ SOLN
30.0000 mL | Freq: Once | INTRAMUSCULAR | Status: DC
  Filled 2023-04-22: qty 30

## 2023-04-22 NOTE — ED Provider Notes (Signed)
[place] EMERGENCY DEPARTMENT AT [facility] Provider Note   CSN: [id number] Arrival date & time: 04/22/23  1127     History  Chief Complaint  Patient presents with   Dental Pain    Laura Mullins is a 57 y.o. female.  Has history of diabetes, hyperlipidemia.  Presents to ER today for evaluation of right-sided facial swelling.  Seen on 04/18/2019 for at [facility] in the ED for facial droop and facial cellulitis, had a CT scan of her face and hand rule out deep space abscess listed showed facial cellulitis. Had a slight facial droop at that time thought to be likely related to the dental pain but MRI was done and ruled out a stroke. She was given Augmentin and has been compliant with this but is still having increased swelling.  Not having fever.  Dental Pain      Home Medications Prior to Admission medications   Medication Sig Start Date End Date Taking? Authorizing Provider  albuterol (PROVENTIL HFA;VENTOLIN HFA) 108 (90 BASE) MCG/ACT inhaler Inhale 2 puffs into the lungs every 6 (six) hours as needed for wheezing or shortness of breath.    [provider]  albuterol (PROVENTIL) (2.5 MG/3ML) 0.083% nebulizer solution Take 2.5 mg by nebulization every 6 (six) hours as needed for wheezing or shortness of breath.    [provider]  aspirin EC 81 MG tablet Take 81 mg by mouth daily.    [provider]  atorvastatin (LIPITOR) 40 MG tablet Take 1 tablet (40 mg total) by mouth daily. 12/14/20   Roma Kayser, MD  Continuous Blood Gluc Receiver (FREESTYLE LIBRE 2 READER) DEVI As directed 06/04/22   Roma Kayser, MD  Continuous Blood Gluc Sensor (FREESTYLE LIBRE 2 SENSOR) MISC 1 Piece by Does not apply route every 14 (fourteen) days. 06/04/22   Roma Kayser, MD  gabapentin (NEURONTIN) 600 MG tablet Take 600 mg by mouth daily as needed (for neuropathy/pain).     [provider]  glipiZIDE (GLUCOTROL XL) 5 MG 24 hr  tablet TAKE 1 TABLET BY MOUTH EVERY DAY WITH BREAKFAST 05/14/22   Roma Kayser, MD  glucose blood (ACCU-CHEK GUIDE) test strip Use as instructed 06/04/22   Roma Kayser, MD  insulin degludec (TRESIBA FLEXTOUCH) 100 UNIT/ML FlexTouch Pen Inject 10 Units into the skin at bedtime. 06/04/22   Roma Kayser, MD  Insulin Pen Needle 32G X 4 MM MISC Use to inject insulin nightly 02/15/22   Roma Kayser, MD  linaclotide [facility]) 290 MCG CAPS capsule Take 1 capsule (290 mcg total) by mouth daily before breakfast. Patient taking differently: Take 290 mcg by mouth as needed.  08/21/18   Adline Potter, NP  lisinopril-hydrochlorothiazide (ZESTORETIC) 20-12.5 MG per tablet Take 1 tablet by mouth daily. 06/04/14   Deatra Canter, FNP  loratadine (CLARITIN) 10 MG tablet Take 10 mg by mouth daily as needed for allergies.    [provider]  metFORMIN (GLUCOPHAGE) 1000 MG tablet Take 500 mg by mouth 2 (two) times daily with a meal.    [provider]  Multiple Vitamins-Calcium (ONE-A-DAY WOMENS PO) Take 1 tablet by mouth. 3 times a week    [provider]  omeprazole (PRILOSEC) 40 MG capsule Take 40 mg by mouth daily.    [provider]      Allergies    Patient has no known allergies.    Review of Systems   Review of Systems  Physical Exam Updated Vital Signs BP (!) 152/106 (BP Location: Right Arm)   Pulse (!) 110   Temp 98.8 F (37.1 C) (Oral)   Resp 18   Ht 5\' 7"  (1.702 m)   Wt 72.6 kg   LMP 06/02/2016   SpO2 99%   BMI 25.06 kg/m  Physical Exam Vitals and nursing note reviewed.  Constitutional:      General: She is not in acute distress.    Appearance: She is well-developed.  HENT:     Head: Normocephalic and atraumatic.     Comments: Swelling with overlying erythema right maxillary area and over nasolabial fold on the right.    Mouth/Throat:     Mouth: Mucous membranes are moist.     Comments: Fluctuant abscess  right upper gingiva. Eyes:     Conjunctiva/sclera: Conjunctivae normal.  Cardiovascular:     Rate and Rhythm: Normal rate and regular rhythm.     Heart sounds: No murmur heard. Pulmonary:     Effort: Pulmonary effort is normal. No respiratory distress.     Breath sounds: Normal breath sounds.  Abdominal:     Palpations: Abdomen is soft.     Tenderness: There is no abdominal tenderness.  Musculoskeletal:        General: No swelling. Normal range of motion.     Cervical back: Neck supple.  Skin:    General: Skin is warm and dry.     Capillary Refill: Capillary refill takes less than 2 seconds.  Neurological:     General: No focal deficit present.     Mental Status: She is alert and oriented to person, place, and time.  Psychiatric:        Mood and Affect: Mood normal.     ED Results / Procedures / Treatments   Labs (all labs ordered are listed, but only abnormal results are displayed) Labs Reviewed - No data to display  EKG None  Radiology No results found.  Procedures .Marland KitchenIncision and Drainage  Date/Time: 04/22/2023 2:08 PM  Performed by: Ma Rings, PA-C Authorized by: Ma Rings, PA-C   Consent:    Consent obtained:  Verbal   Consent given by:  Patient   Risks discussed:  Bleeding, incomplete drainage, pain and infection Universal protocol:    Patient identity confirmed:  Verbally with patient Location:    Type:  Abscess   Location: r uper gingival. Sedation:    Sedation type:  None Anesthesia:    Anesthesia method:  Local infiltration   Local anesthetic:  Lidocaine 1% w/o epi Procedure type:    Complexity:  Simple Procedure details:    Ultrasound guidance: no     Needle aspiration: no     Incision types:  Single straight   Drainage:  Purulent   Drainage amount:  Copious   Packing materials:  None Post-procedure details:    Procedure completion:  Tolerated well, no immediate complications     Medications Ordered in ED Medications   lidocaine (PF) (XYLOCAINE) 1 % injection 30 mL (has no administration in time range)    ED Course/ Medical Decision Making/ A&P                                 Medical Decision Making DDx: Facial cellulitis, abscess, sepsis, other Course: Patient presents ER for right facial swelling.  Seen in the ED at [facility] 5 days ago and prescribed Augmentin after CT  did not show any discrete abscess.  Says she has had increased swelling and pain since then, no fevers or chills, no nausea or vomiting.  She is diabetic sugar today is 345 on her BMP but she states this is pretty normal for her labs ordered since she was slightly tachycardic to rule out significant leukocytosis indicating sepsis though patient is looking well, white blood count is only 10, had been 16 several days ago.  BMP is reassuring.  No signs of DKA.\  I was able to incise and drain the abscess the right upper gingiva and patient had significant relief of the pressure and pain in this area.  She is already been taking 5 days of antibiotics, seems to be helping somewhat as she has had decreasing her leukocytosis but will extend this for an additional 5 days so she is a total of 10 days after the incision drain has been completed.  She has a dentist to call today or tomorrow for close follow-up, advised on strict return precautions.    Amount and/or Complexity of Data Reviewed External Data Reviewed: labs, radiology and notes.    Details: Viewed CT facial and MRI brain from [facility] on visit 5 days ago Labs: ordered. Decision-making details documented in ED Course.  Risk Prescription drug management.           Final Clinical Impression(s) / ED Diagnoses Final diagnoses:  None    Rx / DC Orders ED Discharge Orders     None         Ma Rings, PA-C 04/22/23 1414    Derwood Kaplan, MD 04/23/23 1123

## 2023-04-22 NOTE — Discharge Instructions (Addendum)
To take care of you today.  We drained a dental abscess.  Your blood sugar is elevated.  Sure you take your insulin as indicated.  Finish the antibiotics that you had already picked up and I am going to give you additional 5 days.  Keep using salt water rinses several times a day to help encourage continued drainage.  Follow-up with your dentist as soon as they can see you.  If you have increased pain or swelling, fevers or any other worsening symptoms come back to the ER right away.

## 2023-04-22 NOTE — ED Triage Notes (Signed)
Pt c/o pain and swelling to right side of face; pt states she went to [facility] and they prescribed her amoxicillin and pt has been taking it as prescribed and she states it has not helped with swelling

## 2023-04-30 ENCOUNTER — Telehealth: Payer: Self-pay | Admitting: *Deleted

## 2023-04-30 NOTE — Telephone Encounter (Signed)
Transition Care Management Unsuccessful Follow-up Telephone Call  Date of discharge and from where:  [facility]  04/22/2023  Attempts:  1st Attempt  Reason for unsuccessful TCM follow-up call:  No answer/busy

## 2023-05-02 ENCOUNTER — Telehealth: Payer: Self-pay | Admitting: *Deleted

## 2023-05-02 NOTE — Telephone Encounter (Signed)
Transition Care Management Unsuccessful Follow-up Telephone Call  Date of discharge and from where:  [facility] EMERGENCY DEPARTMENT AT [facility]    05/02/2023  Attempts:  2nd Attempt  Reason for unsuccessful TCM follow-up call:  No answer/busy

## 2023-05-07 DIAGNOSIS — R5383 Other fatigue: Secondary | ICD-10-CM | POA: Diagnosis not present

## 2023-05-07 DIAGNOSIS — Z794 Long term (current) use of insulin: Secondary | ICD-10-CM | POA: Diagnosis not present

## 2023-05-07 DIAGNOSIS — E782 Mixed hyperlipidemia: Secondary | ICD-10-CM | POA: Diagnosis not present

## 2023-05-07 DIAGNOSIS — E1165 Type 2 diabetes mellitus with hyperglycemia: Secondary | ICD-10-CM | POA: Diagnosis not present

## 2023-05-17 ENCOUNTER — Encounter: Payer: Self-pay | Admitting: "Endocrinology

## 2023-05-17 ENCOUNTER — Ambulatory Visit (INDEPENDENT_AMBULATORY_CARE_PROVIDER_SITE_OTHER): Payer: Managed Care, Other (non HMO) | Admitting: "Endocrinology

## 2023-05-17 VITALS — BP 108/76 | HR 88 | Ht 67.0 in | Wt 161.0 lb

## 2023-05-17 DIAGNOSIS — I10 Essential (primary) hypertension: Secondary | ICD-10-CM

## 2023-05-17 DIAGNOSIS — Z794 Long term (current) use of insulin: Secondary | ICD-10-CM | POA: Diagnosis not present

## 2023-05-17 DIAGNOSIS — E1165 Type 2 diabetes mellitus with hyperglycemia: Secondary | ICD-10-CM

## 2023-05-17 DIAGNOSIS — Z91199 Patient's noncompliance with other medical treatment and regimen due to unspecified reason: Secondary | ICD-10-CM

## 2023-05-17 DIAGNOSIS — E782 Mixed hyperlipidemia: Secondary | ICD-10-CM | POA: Diagnosis not present

## 2023-05-17 LAB — POCT GLYCOSYLATED HEMOGLOBIN (HGB A1C)

## 2023-05-17 MED ORDER — ACCU-CHEK GUIDE VI STRP: ORAL_STRIP | 2 refills | Status: AC

## 2023-05-17 MED ORDER — FREESTYLE LIBRE 3 SENSOR MISC: 1.0000 | 2 refills | Status: DC

## 2023-05-17 MED ORDER — ACCU-CHEK GUIDE ME W/DEVICE KIT: 1.0000 | PACK | 0 refills | Status: DC

## 2023-05-17 MED ORDER — FREESTYLE LIBRE 3 READER DEVI: 1.0000 | Freq: Once | 0 refills | Status: DC | PRN

## 2023-05-17 MED ORDER — EMPAGLIFLOZIN 10 MG PO TABS: 10.0000 mg | ORAL_TABLET | Freq: Every day | ORAL | 1 refills | Status: DC

## 2023-05-17 MED ORDER — ROSUVASTATIN CALCIUM 10 MG PO TABS: 10.0000 mg | ORAL_TABLET | Freq: Every day | ORAL | 1 refills | Status: AC

## 2023-05-17 MED ORDER — MAGNESIUM 250 MG PO TABS: 1.0000 | ORAL_TABLET | Freq: Two times a day (BID) | ORAL | 0 refills | Status: AC

## 2023-05-17 MED ORDER — TRESIBA FLEXTOUCH 100 UNIT/ML ~~LOC~~ SOPN: 20.0000 [IU] | PEN_INJECTOR | Freq: Every day | SUBCUTANEOUS | 0 refills | Status: DC

## 2023-05-17 NOTE — Patient Instructions (Signed)
                                     Advice for Weight Management  -For most of us the best way to lose weight is by diet management. Generally speaking, diet management means consuming less calories intentionally which over time brings about progressive weight loss.  This can be achieved more effectively by avoiding ultra processed carbohydrates, processed meats, unhealthy fats.    It is critically important to know your numbers: how much calorie you are consuming and how much calorie you need. More importantly, our carbohydrates sources should be unprocessed naturally occurring  complex starch food items.  It is always important to balance nutrition also by  appropriate intake of proteins (mainly plant-based), healthy fats/oils, plenty of fruits and vegetables.   -The American College of Lifestyle Medicine (ACL M) recommends nutrition derived mostly from Whole Food, Plant Predominant Sources example an apple instead of applesauce or apple pie. Eat Plenty of vegetables, Mushrooms, fruits, Legumes, Whole Grains, Nuts, seeds in lieu of processed meats, processed snacks/pastries red meat, poultry, eggs.  Use only water or unsweetened tea for hydration.  The College also recommends the need to stay away from risky substances including alcohol, smoking; obtaining 7-9 hours of restorative sleep, at least 150 minutes of moderate intensity exercise weekly, importance of healthy social connections, and being mindful of stress and seek help when it is overwhelming.    -Sticking to a routine mealtime to eat 3 meals a day and avoiding unnecessary snacks is shown to have a big role in weight control. Under normal circumstances, the only time we burn stored energy is when we are hungry, so allow  some hunger to take place- hunger means no food between appropriate meal times, only water.  It is not advisable to starve.   -It is better to avoid simple carbohydrates including:  Cakes, Sweet Desserts, Ice Cream, Soda (diet and regular), Sweet Tea, Candies, Chips, Cookies, Store Bought Juices, Alcohol in Excess of  1-2 drinks a day, Lemonade,  Artificial Sweeteners, Doughnuts, Coffee Creamers, "Sugar-free" Products, etc, etc.  This is not a complete list.....    -Consulting with certified diabetes educators is proven to provide you with the most accurate and current information on diet.  Also, you may be  interested in discussing diet options/exchanges , we can schedule a visit with Laura Mullins, RDN, CDE for individualized nutrition education.  -Exercise: If you are able: 30 -60 minutes a day ,4 days a week, or 150 minutes of moderate intensity exercise weekly.    The longer the better if tolerated.  Combine stretch, strength, and aerobic activities.  If you were told in the past that you have high risk for cardiovascular diseases, or if you are currently symptomatic, you may seek evaluation by your heart doctor prior to initiating moderate to intense exercise programs.                                  Additional Care Considerations for Diabetes/Prediabetes   -Diabetes  is a chronic disease.  The most important care consideration is regular follow-up with your diabetes care provider with the goal being avoiding or delaying its complications and to take advantage of advances in medications and technology.  If appropriate actions are taken early enough, type 2 diabetes can even be   reversed.  Seek information from the right source.  - Whole Food, Plant Predominant Nutrition is highly recommended: Eat Plenty of vegetables, Mushrooms, fruits, Legumes, Whole Grains, Nuts, seeds in lieu of processed meats, processed snacks/pastries red meat, poultry, eggs as recommended by American College of  Lifestyle Medicine (ACLM).  -Type 2 diabetes is known to coexist with other important comorbidities such as high blood pressure and high cholesterol.  It is critical to control not only the  diabetes but also the high blood pressure and high cholesterol to minimize and delay the risk of complications including coronary artery disease, stroke, amputations, blindness, etc.  The good news is that this diet recommendation for type 2 diabetes is also very helpful for managing high cholesterol and high blood blood pressure.  - Studies showed that people with diabetes will benefit from a class of medications known as ACE inhibitors and statins.  Unless there are specific reasons not to be on these medications, the standard of care is to consider getting one from these groups of medications at an optimal doses.  These medications are generally considered safe and proven to help protect the heart and the kidneys.    - People with diabetes are encouraged to initiate and maintain regular follow-up with eye doctors, foot doctors, dentists , and if necessary heart and kidney doctors.     - It is highly recommended that people with diabetes quit smoking or stay away from smoking, and get yearly  flu vaccine and pneumonia vaccine at least every 5 years.  See above for additional recommendations on exercise, sleep, stress management , and healthy social connections.      

## 2023-05-17 NOTE — Progress Notes (Signed)
05/17/2023, 6:47 PM  Endocrinology follow-up note   Subjective:    Patient ID: Laura Mullins, female    DOB: 05/01/1966.  Renee Rival is being seen in follow-up after she was seen in consultation for management of currently uncontrolled symptomatic diabetes requested by  Benetta Spar, MD.   Past Medical History:  Diagnosis Date   Arthritis    Asthma    Diabetes mellitus without complication (HCC)    GERD (gastroesophageal reflux disease)    Hyperlipidemia    Hypertension    Peptic ulcer    Premenopause menorrhagia    Trichimoniasis    Trichimoniasis 11/25/2020   11/25/20 treated    Past Surgical History:  Procedure Laterality Date   BACK SURGERY     CHOLECYSTECTOMY      Social History   Socioeconomic History   Marital status: Married    Spouse name: Not on file   Number of children: Not on file   Years of education: Not on file   Highest education level: Not on file  Occupational History   Not on file  Tobacco Use   Smoking status: Never   Smokeless tobacco: Never  Vaping Use   Vaping status: Never Used  Substance and Sexual Activity   Alcohol use: No   Drug use: No   Sexual activity: Not Currently    Birth control/protection: Post-menopausal  Other Topics Concern   Not on file  Social History Narrative   Not on file   Social Determinants of Health   Financial Resource Strain: Not on file  Food Insecurity: Not on file  Transportation Needs: Not on file  Physical Activity: Not on file  Stress: Not on file  Social Connections: Not on file    Family History  Problem Relation Age of Onset   Hypertension Mother    Hyperlipidemia Mother    Diabetes Mother    Stroke Father    Heart disease Father    Heart attack Father    Alcohol abuse Father    Cancer Sister        leukemia   Diabetes Sister    Hypertension Sister    Heart disease Sister     Alcohol abuse Sister    Schizophrenia Sister    Cancer Brother    Diabetes Brother    Diabetes Maternal Grandmother    Cancer Maternal Grandmother    Alzheimer's disease Maternal Grandmother    Cancer Sister    Diabetes Sister    Cancer Sister    Diabetes Sister    Diabetes Sister    Diabetes Sister    Diabetes Sister    Diabetes Sister    Cancer Brother    Diabetes Brother    Diabetes Brother    Diabetes Brother    Diabetes Brother    Heart disease Brother    Diabetes Brother    Cancer Brother        colon   Diabetes Brother    Hyperlipidemia Brother    Diabetes Brother    Diabetes Brother     Outpatient Encounter Medications as of 05/17/2023  Medication Sig   Blood Glucose Monitoring Suppl (ACCU-CHEK GUIDE ME) w/Device KIT 1 Piece by Does not apply route as directed.   Continuous Glucose Receiver (FREESTYLE LIBRE 3 READER) DEVI 1 Piece by Does not apply route once as needed for up to 1 dose.   Continuous Glucose Sensor (FREESTYLE LIBRE 3 SENSOR) MISC 1 Piece by Does not apply route every 14 (fourteen) days. Place 1 sensor on the skin every 14 days. Use to check glucose continuously   empagliflozin (JARDIANCE) 10 MG TABS tablet Take 1 tablet (10 mg total) by mouth daily before breakfast.   Magnesium 250 MG TABS Take 1 tablet (250 mg total) by mouth 2 (two) times daily with a meal.   rosuvastatin (CRESTOR) 10 MG tablet Take 1 tablet (10 mg total) by mouth daily.   albuterol (PROVENTIL HFA;VENTOLIN HFA) 108 (90 BASE) MCG/ACT inhaler Inhale 2 puffs into the lungs every 6 (six) hours as needed for wheezing or shortness of breath.   albuterol (PROVENTIL) (2.5 MG/3ML) 0.083% nebulizer solution Take 2.5 mg by nebulization every 6 (six) hours as needed for wheezing or shortness of breath.   aspirin EC 81 MG tablet Take 81 mg by mouth daily.   gabapentin (NEURONTIN) 600 MG tablet Take 600 mg by mouth daily as needed (for neuropathy/pain).    glucose blood (ACCU-CHEK GUIDE) test strip  Use to monitor glucose 4 times a day  as instructed   insulin degludec (TRESIBA FLEXTOUCH) 100 UNIT/ML FlexTouch Pen Inject 20 Units into the skin at bedtime.   Insulin Pen Needle 32G X 4 MM MISC Use to inject insulin nightly   linaclotide (LINZESS) 290 MCG CAPS capsule Take 1 capsule (290 mcg total) by mouth daily before breakfast. (Patient taking differently: Take 290 mcg by mouth as needed. )   lisinopril-hydrochlorothiazide (ZESTORETIC) 20-12.5 MG per tablet Take 1 tablet by mouth daily.   loratadine (CLARITIN) 10 MG tablet Take 10 mg by mouth daily as needed for allergies.   metFORMIN (GLUCOPHAGE) 1000 MG tablet Take 500 mg by mouth 2 (two) times daily with a meal.   Multiple Vitamins-Calcium (ONE-A-DAY WOMENS PO) Take 1 tablet by mouth. 3 times a week   omeprazole (PRILOSEC) 40 MG capsule Take 40 mg by mouth daily.   [DISCONTINUED] atorvastatin (LIPITOR) 40 MG tablet Take 1 tablet (40 mg total) by mouth daily.   [DISCONTINUED] Continuous Blood Gluc Receiver (FREESTYLE LIBRE 2 READER) DEVI As directed   [DISCONTINUED] Continuous Blood Gluc Sensor (FREESTYLE LIBRE 2 SENSOR) MISC 1 Piece by Does not apply route every 14 (fourteen) days.   [DISCONTINUED] glipiZIDE (GLUCOTROL XL) 5 MG 24 hr tablet TAKE 1 TABLET BY MOUTH EVERY DAY WITH BREAKFAST   [DISCONTINUED] glucose blood (ACCU-CHEK GUIDE) test strip Use as instructed   [DISCONTINUED] insulin degludec (TRESIBA FLEXTOUCH) 100 UNIT/ML FlexTouch Pen Inject 10 Units into the skin at bedtime. (Patient not taking: Reported on 05/17/2023)   No facility-administered encounter medications on file as of 05/17/2023.    ALLERGIES: No Known Allergies  VACCINATION STATUS: Immunization History  Administered Date(s) Administered   Influenza-Unspecified 09/03/2018   Moderna Sars-Covid-2 Vaccination 11/30/2019, 12/28/2019    Diabetes She presents for her follow-up diabetic visit. She has type 2 diabetes mellitus. Onset time: She was diagnosed at  approximate age of 57 years. Her disease course has been worsening (She missed her appointment since October 2023.). There are no hypoglycemic associated symptoms. Pertinent negatives for hypoglycemia include no confusion, headaches, pallor or seizures (.1a). Associated symptoms include polydipsia and  polyuria. Pertinent negatives for diabetes include no chest pain, no fatigue and no polyphagia. There are no hypoglycemic complications. Symptoms are worsening. There are no diabetic complications. Risk factors for coronary artery disease include diabetes mellitus, dyslipidemia, hypertension and family history. Current diabetic treatments: She is currently on NovoLog 70/30 45 units twice daily, as well as Metformin 1000 g p.o. twice daily. Her weight is fluctuating minimally (She gained 5 pounds which is a good development for her.). She is following a generally unhealthy diet. When asked about meal planning, she reported none. She has not had a previous visit with a dietitian. (She presents without any meter or logs.  Patient missed her appointment since October 2023 when her A1c was 7.3%.  Her point-of-care A1c today is 12.6%.  Admittedly, she did not stay on her [place].  She is only on metformin 500 mg p.o. twice daily.  She denies any episodes of hypoglycemia.    ) An ACE inhibitor/angiotensin II receptor blocker is being taken.  Hyperlipidemia This is a chronic problem. The current episode started more than 1 year ago. The problem is uncontrolled. Exacerbating diseases include diabetes. Pertinent negatives include no chest pain, myalgias or shortness of breath. Current antihyperlipidemic treatment includes statins. Risk factors for coronary artery disease include dyslipidemia, diabetes mellitus, hypertension, a sedentary lifestyle and family history.  Hypertension This is a chronic problem. The problem is controlled. Pertinent negatives include no chest pain, headaches, palpitations or shortness of breath.  Risk factors for coronary artery disease include dyslipidemia, diabetes mellitus and family history. Past treatments include ACE inhibitors and diuretics.     Review of Systems  Constitutional:  Negative for chills, fatigue, fever and unexpected weight change.  HENT:  Negative for trouble swallowing and voice change.   Eyes:  Negative for visual disturbance.  Respiratory:  Negative for cough, shortness of breath and wheezing.   Cardiovascular:  Negative for chest pain, palpitations and leg swelling.  Gastrointestinal:  Negative for diarrhea, nausea and vomiting.  Endocrine: Positive for polydipsia and polyuria. Negative for cold intolerance, heat intolerance and polyphagia.  Musculoskeletal:  Negative for arthralgias and myalgias.  Skin:  Negative for color change, pallor, rash and wound.  Neurological:  Negative for seizures (.1a) and headaches.  Psychiatric/Behavioral:  Negative for confusion and suicidal ideas.     Objective:       05/17/2023   11:21 AM 04/22/2023   11:45 AM 06/04/2022   10:16 AM  Vitals with BMI  Height 5\' 7"  5\' 7"  5\' 7"   Weight 161 lbs 160 lbs 170 lbs 10 oz  BMI 25.21 25.05 26.71  Systolic 108 152 884  Diastolic 76 106 86  Pulse 88 110 80    BP 108/76   Pulse 88   Ht 5\' 7"  (1.702 m)   Wt 161 lb (73 kg)   LMP 06/02/2016   BMI 25.22 kg/m   Wt Readings from Last 3 Encounters:  05/17/23 161 lb (73 kg)  04/22/23 160 lb (72.6 kg)  06/04/22 170 lb 9.6 oz (77.4 kg)       CMP ( most recent) CMP     Component Value Date/Time   NA 131 (L) 05/07/2023 1103   K 4.7 05/07/2023 1103   CL 96 05/07/2023 1103   CO2 20 05/07/2023 1103   GLUCOSE 392 (H) 05/07/2023 1103   GLUCOSE 345 (H) 04/22/2023 1231   BUN 12 05/07/2023 1103   CREATININE 1.03 (H) 05/07/2023 1103   CREATININE 0.69 08/12/2020 1045   CALCIUM  9.8 05/07/2023 1103   PROT 7.3 05/07/2023 1103   ALBUMIN 4.1 05/07/2023 1103   AST 9 05/07/2023 1103   ALT 9 05/07/2023 1103   ALKPHOS 128 (H)  05/07/2023 1103   BILITOT 0.4 05/07/2023 1103   GFRNONAA >60 04/22/2023 1231   GFRNONAA 99 08/12/2020 1045   GFRAA 114 08/12/2020 1045     Diabetic Labs (most recent): Lab Results  Component Value Date   HGBA1C 7.3 (A) 06/04/2022   HGBA1C 10.2 (A) 02/08/2022   HGBA1C 7.2 (A) 04/27/2021   MICROALBUR 80 12/14/2020   MICROALBUR 30 08/16/2020   Lipid Panel     Component Value Date/Time   CHOL 271 (H) 05/07/2023 1103   TRIG 163 (H) 05/07/2023 1103   HDL 43 05/07/2023 1103   CHOLHDL 6.3 (H) 05/07/2023 1103   CHOLHDL 5.8 (H) 08/12/2020 1045   LDLCALC 197 (H) 05/07/2023 1103   LDLCALC 173 (H) 08/12/2020 1045   LABVLDL 31 05/07/2023 1103     Assessment & Plan:   1. Type 2 diabetes mellitus with hyperglycemia, with long-term current use of insulin (HCC)  - DESTYNEE ANGERER has currently uncontrolled symptomatic type 2 DM since  57 years of age.  She presents without any meter or logs.  Patient missed her appointment since October 2023 when her A1c was 7.3%.  Her point-of-care A1c today is 12.6%.  Admittedly, she did not stay on her [place].  She is only on metformin 500 mg p.o. twice daily.  She denies any episodes of hypoglycemia.     - I had a long discussion with her about the progressive nature of diabetes and the pathology behind its complications. -her diabetes is complicated by peripheral arterial disease, peripheral neuropathy and she remains at exceedingly  high risk for more acute and chronic complications which include CAD, CVA, CKD, retinopathy, and neuropathy. These are all discussed in detail with her.  - I have counseled her on diet  and weight management  by adopting a carbohydrate restricted/protein rich diet. Patient is encouraged to switch to  unprocessed or minimally processed     complex starch and increased protein intake mostly plant source, fruits, and vegetables.   - she acknowledges that there is a room for improvement in her food and drink choices. -  Suggestion is made for her to avoid simple carbohydrates  from her diet including Cakes, Sweet Desserts, Ice Cream, Soda (diet and regular), Sweet Tea, Candies, Chips, Cookies, Store Bought Juices, Alcohol , Artificial Sweeteners,  Coffee Creamer, and "Sugar-free" Products, Lemonade. This will help patient to have more stable blood glucose profile and potentially avoid unintended weight gain.  The following Lifestyle Medicine recommendations according to American College of Lifestyle Medicine  [facility]) were discussed and and offered to patient and she  agrees to start the journey:  A. Whole Foods, Plant-Based Nutrition comprising of fruits and vegetables, plant-based proteins, whole-grain carbohydrates was discussed in detail with the patient.   A list for source of those nutrients were also provided to the patient.  Patient will use only water or unsweetened tea for hydration. B.  The need to stay away from risky substances including alcohol, smoking; obtaining 7 to 9 hours of restorative sleep, at least 150 minutes of moderate intensity exercise weekly, the importance of healthy social connections,  and stress management techniques were discussed. C.  A full color page of  Calorie density of various food groups per pound showing examples of each food groups was provided to the  patient.    - she will continue follow-up with  Norm Salt, RDN, CDE for diabetes education.  - I have approached her with the following individualized plan to manage  her diabetes and patient agrees:   -In light of her presentation with severe, persistent hyperglycemia, she will need multiple medications in order for her to achieve control of diabetes to target. Accordingly, I discussed and initiated her Tresiba at 20 units nightly associated with monitoring of blood glucose 4 times a day-before meals and at bedtime. - She is encouraged to call clinic for hypoglycemia below 70 or hyperglycemia above 300 mg per DL.   -This  patient will also benefit from a CGM.  I discussed and prescribed a freestyle libre device for her. - She is advised to continue metformin 500 mg p.o. twice daily with breakfast and supper. - She is advised to discontinue glipizide and start on Jardiance 10 mg p.o. daily at breakfast.  Side effects and precautions discussed with her.      - Specific targets for  A1c;  LDL, HDL,  and Triglycerides were discussed with the patient.  2) Blood Pressure /Hypertension:  -Her blood pressure is controlled to target.  She has mild microalbuminuria. she is advised to continue her current medications including lisinopril/HCTZ 20/12.5 mg p.o. daily.   3) Lipids/Hyperlipidemia: Her recent lipid panel showed uncontrolled LDL at  197.  She was supposed to be on Lipitor 40 mg p.o. nightly.  Patient is not sure if she still has it.  I discussed and initiated Crestor 10 mg p.o. nightly due to her exceedingly high risk for cardiovascular disease.  Side effects and precautions discussed with her.     4)  Weight/Diet:  Body mass index is 25.22 kg/m.  -She is reversing her catabolic phase, presents with 5 pounds of weight gain.  This is a good development for her.  She is not a candidate for weight loss.   This is helping her diabetes and high blood pressure.  she is not  a candidate for any more major weight loss.  Exercise, and detailed carbohydrates information provided  -  detailed on discharge instructions.  5) Chronic Care/Health Maintenance:  -she  Is on ACEI/ARB and Statin medications and  is encouraged to initiate and continue to follow up with Ophthalmology, Dentist,  Podiatrist at least yearly or according to recommendations, and advised to   stay away from smoking. I have recommended yearly flu vaccine and pneumonia vaccine at least every 5 years; moderate intensity exercise for up to 150 minutes weekly; and  sleep for at least 7 hours a day.  Her screening ABI was negative for PAD in December 2021.  This  study will be repeated in 2026 or sooner if needed.    - she is  advised to maintain close follow up with Benetta Spar, MD for primary care needs, as well as her other providers for optimal and coordinated care.     I spent  42  minutes in the care of the patient today including review of labs from CMP, Lipids, Thyroid Function, Hematology (current and previous including abstractions from other facilities); face-to-face time discussing  her blood glucose readings/logs, discussing hypoglycemia and hyperglycemia episodes and symptoms, medications doses, her options of short and long term treatment based on the latest standards of care / guidelines;  discussion about incorporating lifestyle medicine;  and documenting the encounter. Risk reduction counseling performed per USPSTF guidelines to reduce  cardiovascular risk factors.  Please refer to Patient Instructions for Blood Glucose Monitoring and Insulin/Medications Dosing Guide"  in media tab for additional information. Please  also refer to " Patient Self Inventory" in the Media  tab for reviewed elements of pertinent patient history.  Renee Rival participated in the discussions, expressed understanding, and voiced agreement with the above plans.  All questions were answered to her satisfaction. she is encouraged to contact clinic should she have any questions or concerns prior to her return visit.  Follow up plan: - Return in about 5 weeks (around 06/21/2023) for F/U with Meter/CGM Megan Salon Only - no Labs.  Marquis Lunch, MD [facility] [address] [place], [place] [address] Phone: [phone]  Fax: [phone]    05/17/2023, 6:47 PM  This note was partially dictated with voice recognition software. Similar sounding words can be transcribed inadequately or may not  be corrected upon review.

## 2023-06-07 DIAGNOSIS — E1165 Type 2 diabetes mellitus with hyperglycemia: Secondary | ICD-10-CM | POA: Diagnosis not present

## 2023-06-07 DIAGNOSIS — Z1389 Encounter for screening for other disorder: Secondary | ICD-10-CM | POA: Diagnosis not present

## 2023-06-07 DIAGNOSIS — Z23 Encounter for immunization: Secondary | ICD-10-CM | POA: Diagnosis not present

## 2023-06-07 DIAGNOSIS — Z1331 Encounter for screening for depression: Secondary | ICD-10-CM | POA: Diagnosis not present

## 2023-06-07 DIAGNOSIS — I10 Essential (primary) hypertension: Secondary | ICD-10-CM | POA: Diagnosis not present

## 2023-06-07 DIAGNOSIS — E782 Mixed hyperlipidemia: Secondary | ICD-10-CM | POA: Diagnosis not present

## 2023-06-07 DIAGNOSIS — Z91199 Patient's noncompliance with other medical treatment and regimen due to unspecified reason: Secondary | ICD-10-CM | POA: Diagnosis not present

## 2023-06-07 DIAGNOSIS — Z794 Long term (current) use of insulin: Secondary | ICD-10-CM | POA: Diagnosis not present

## 2023-06-19 DIAGNOSIS — K047 Periapical abscess without sinus: Secondary | ICD-10-CM | POA: Diagnosis not present

## 2023-06-19 DIAGNOSIS — K089 Disorder of teeth and supporting structures, unspecified: Secondary | ICD-10-CM | POA: Diagnosis not present

## 2023-06-25 ENCOUNTER — Ambulatory Visit: Payer: Managed Care, Other (non HMO) | Admitting: "Endocrinology

## 2023-06-25 ENCOUNTER — Telehealth: Payer: Self-pay | Admitting: "Endocrinology

## 2023-06-25 MED ORDER — FREESTYLE LIBRE 3 SENSOR MISC: 1.0000 | 2 refills | Status: DC

## 2023-06-25 MED ORDER — FREESTYLE LIBRE 3 READER DEVI: 1.0000 | Freq: Once | 0 refills | Status: DC | PRN

## 2023-06-25 NOTE — Telephone Encounter (Signed)
Rx sent 

## 2023-06-25 NOTE — Telephone Encounter (Signed)
Pt stated that she never got meter or libre due to pharmacy stating they never recievved prescription.  Can you send into [facility] instead of [facility].

## 2023-07-11 ENCOUNTER — Ambulatory Visit: Payer: Managed Care, Other (non HMO) | Admitting: Adult Health

## 2023-07-11 ENCOUNTER — Other Ambulatory Visit (HOSPITAL_COMMUNITY)
Admission: RE | Admit: 2023-07-11 | Discharge: 2023-07-11 | Disposition: A | Discharge status: Home / Self Care | Payer: Managed Care, Other (non HMO) | Source: Ambulatory Visit | Attending: Adult Health | Admitting: Adult Health

## 2023-07-11 ENCOUNTER — Encounter: Payer: Self-pay | Admitting: Adult Health

## 2023-07-11 VITALS — BP 126/82 | HR 100 | Ht 67.0 in | Wt 165.0 lb

## 2023-07-11 DIAGNOSIS — B372 Candidiasis of skin and nail: Secondary | ICD-10-CM | POA: Insufficient documentation

## 2023-07-11 DIAGNOSIS — Z01419 Encounter for gynecological examination (general) (routine) without abnormal findings: Secondary | ICD-10-CM | POA: Diagnosis present

## 2023-07-11 DIAGNOSIS — Z1211 Encounter for screening for malignant neoplasm of colon: Secondary | ICD-10-CM | POA: Diagnosis not present

## 2023-07-11 DIAGNOSIS — Z1331 Encounter for screening for depression: Secondary | ICD-10-CM

## 2023-07-11 DIAGNOSIS — Z1212 Encounter for screening for malignant neoplasm of rectum: Secondary | ICD-10-CM | POA: Diagnosis not present

## 2023-07-11 DIAGNOSIS — Z1231 Encounter for screening mammogram for malignant neoplasm of breast: Secondary | ICD-10-CM

## 2023-07-11 LAB — HEMOCCULT GUIAC POC 1CARD (OFFICE): Fecal Occult Blood, POC: NEGATIVE

## 2023-07-11 MED ORDER — NYSTATIN-TRIAMCINOLONE 100000-0.1 UNIT/GM-% EX OINT: 1.0000 | TOPICAL_OINTMENT | Freq: Two times a day (BID) | CUTANEOUS | 0 refills | Status: DC

## 2023-07-11 NOTE — Progress Notes (Signed)
Patient ID: Laura Mullins, female   DOB: [birth year], 57 y.o.   MRN: [id number] History of Present Illness: Laura Mullins is a 57 year old black female,married, PM in for a well woman gyn exam and pap. She thinks she may have yeast, had rash of fine bumps and some itching after antibiotics.  PCP is Dr Felecia Shelling.   Current Medications, Allergies, Past Medical History, Past Surgical History, Family History and Social History were reviewed in [employer] record.     Review of Systems: Patient denies any headaches, hearing loss, fatigue, blurred vision, shortness of breath, chest pain, abdominal pain, problems with bowel movements, urination, or intercourse. No joint pain or mood swings.  Has had some itching Denies any vaginal bleeding   Physical Exam:BP 126/82 (BP Location: Left Arm, Patient Position: Sitting, Cuff Size: Normal)   Pulse 100   Ht 5\' 7"  (1.702 m)   Wt 165 lb (74.8 kg)   LMP 06/02/2016   BMI 25.84 kg/m   General:  Well developed, well nourished, no acute distress Skin:  Warm and dry Neck:  Midline trachea, normal thyroid, good ROM, no lymphadenopathy Lungs; Clear to auscultation bilaterally Breast:  No dominant palpable mass, retraction, or nipple discharge, has discoloration in bra line under right breast, was itching, sleeps in her bra, but will stop  Cardiovascular: Regular rate and rhythm Abdomen:  Soft, non tender, no hepatosplenomegaly Pelvic:  External genitalia is normal in appearance, mildly red about clitoral hood.   The vagina is normal in appearance. Urethra has no lesions or masses. The cervix is smooth, pap with HR HPV genotyping performed.  Uterus is felt to be normal size, shape, and contour.  No adnexal masses or tenderness noted.Bladder is non tender, no masses felt. Rectal: Good sphincter tone, no polyps, or hemorrhoids felt.  Hemoccult negative. Extremities/musculoskeletal:  No swelling or varicosities noted, no clubbing or cyanosis Psych:   No mood changes, alert and cooperative,seems happy AA is 0 Fall risk is low    07/11/2023    9:57 AM 06/27/2020    8:08 AM 08/21/2018   11:05 AM  Depression screen PHQ 2/9  Decreased Interest 0 0 0  Down, Depressed, Hopeless 0 0 0  PHQ - 2 Score 0 0 0  Altered sleeping 0    Tired, decreased energy 0    Change in appetite 1    Feeling bad or failure about yourself  0    Trouble concentrating 0    Moving slowly or fidgety/restless 0    Suicidal thoughts 0    PHQ-9 Score 1    Difficult doing work/chores Not difficult at all         07/11/2023   10:00 AM  GAD 7 : Generalized Anxiety Score  Nervous, Anxious, on Edge 0  Control/stop worrying 0  Worry too much - different things 0  Trouble relaxing 1  Restless 0  Easily annoyed or irritable 0  Afraid - awful might happen 0  Total GAD 7 Score 1  Anxiety Difficulty Not difficult at all      Upstream - 07/11/23 0954       Pregnancy Intention Screening   Does the patient want to become pregnant in the next year? N/A    Does the patient's partner want to become pregnant in the next year? N/A    Would the patient like to discuss contraceptive options today? N/A      Contraception Wrap Up   Current Method No Method -  Other Reason   PM   Reason for No Current Contraceptive Method at Intake ([facility] Only) Other    End Method No Method - Other Reason   PM   Contraception Counseling Provided No            Examination chaperoned by Malachy Mood LPN  Impression and plan: 1. Encounter for gynecological examination with Papanicolaou smear of cervix Pap sent Pap in 3 years if normal Physical in 1 year Labs with Dr Fransico Him  - Cytology - PAP( [place])  2. Encounter for screening fecal occult blood testing Hemoccult was negative  - POCT occult blood stool  3. Yeast infection of the skin Some redness above clitoral hood Will rx mycolog ointment Meds ordered this encounter  Medications   nystatin-triamcinolone ointment  (MYCOLOG)    Sig: Apply 1 Application topically 2 (two) times daily.    Dispense:  30 g    Refill:  0    Order Specific Question:   Supervising Provider    Answer:   Despina Hidden, LUTHER H [[id number]]     4. Screening for colorectal cancer Refer to [facility] for colonoscopy  - Ambulatory referral to Gastroenterology  5. Screening mammogram for breast cancer Mammogram scheduled for her 07/17/23 at [facility] at 3:30 pm - MM 3D SCREENING MAMMOGRAM BILATERAL BREAST; Future

## 2023-07-15 ENCOUNTER — Encounter: Payer: Self-pay | Admitting: *Deleted

## 2023-07-15 LAB — CYTOLOGY - PAP
Comment: NEGATIVE
Diagnosis: NEGATIVE
High risk HPV: NEGATIVE

## 2023-07-17 ENCOUNTER — Ambulatory Visit (HOSPITAL_COMMUNITY)
Admission: RE | Admit: 2023-07-17 | Discharge: 2023-07-17 | Disposition: A | Discharge status: Home / Self Care | Payer: Managed Care, Other (non HMO) | Source: Ambulatory Visit | Attending: Adult Health | Admitting: Adult Health

## 2023-07-17 DIAGNOSIS — Z1231 Encounter for screening mammogram for malignant neoplasm of breast: Secondary | ICD-10-CM | POA: Diagnosis present

## 2023-07-22 ENCOUNTER — Other Ambulatory Visit (HOSPITAL_COMMUNITY): Payer: Self-pay | Admitting: Adult Health

## 2023-07-22 DIAGNOSIS — R928 Other abnormal and inconclusive findings on diagnostic imaging of breast: Secondary | ICD-10-CM

## 2023-07-23 ENCOUNTER — Telehealth: Payer: Self-pay | Admitting: "Endocrinology

## 2023-07-23 ENCOUNTER — Other Ambulatory Visit (HOSPITAL_COMMUNITY): Payer: Self-pay

## 2023-07-23 DIAGNOSIS — E1165 Type 2 diabetes mellitus with hyperglycemia: Secondary | ICD-10-CM

## 2023-07-23 NOTE — Telephone Encounter (Signed)
Pt states the [facility] [facility] is needing a PA, have you seen anything for this?

## 2023-07-24 MED ORDER — DEXCOM G7 RECEIVER DEVI: 0 refills | Status: AC

## 2023-07-24 MED ORDER — DEXCOM G7 SENSOR MISC: 1 refills | Status: DC

## 2023-07-24 NOTE — Telephone Encounter (Signed)
Can someone put in a prescription for the dexcom since insurance is showing that that's what they prefer.

## 2023-07-24 NOTE — Telephone Encounter (Signed)
 Dexcom sent

## 2023-08-07 ENCOUNTER — Telehealth: Payer: Self-pay

## 2023-08-07 ENCOUNTER — Telehealth: Payer: Self-pay | Admitting: "Endocrinology

## 2023-08-07 ENCOUNTER — Other Ambulatory Visit (HOSPITAL_COMMUNITY): Payer: Self-pay

## 2023-08-07 ENCOUNTER — Ambulatory Visit: Payer: Managed Care, Other (non HMO) | Admitting: "Endocrinology

## 2023-08-07 NOTE — Telephone Encounter (Signed)
Pt said she picked up her sensors but the receiver needs a PA. Can you check on this?

## 2023-08-07 NOTE — Telephone Encounter (Signed)
Pharmacy Patient Advocate Encounter   Received notification from Pt Calls Messages that prior authorization for Dexcom G7 receiver is required/requested.   Insurance verification completed.   The patient is insured through [employer] [facility] [employer] [facility] .   Per test claim: PA required; PA submitted to above mentioned insurance via CoverMyMeds Key/confirmation #/EOC  [id number] Status is pending   PA has been APPROVED through 08/06/24 PA Case ID #: [id number]

## 2023-08-19 ENCOUNTER — Other Ambulatory Visit (HOSPITAL_COMMUNITY): Payer: Self-pay

## 2023-08-19 NOTE — Telephone Encounter (Signed)
Patient is calling back she has not received any information on her PA for her Receiver. Please call pt and follow up.

## 2023-08-20 ENCOUNTER — Ambulatory Visit (HOSPITAL_COMMUNITY)
Admission: RE | Admit: 2023-08-20 | Discharge: 2023-08-20 | Disposition: A | Discharge status: Home / Self Care | Payer: Managed Care, Other (non HMO) | Source: Ambulatory Visit | Attending: Adult Health | Admitting: Adult Health

## 2023-08-20 ENCOUNTER — Encounter (HOSPITAL_COMMUNITY): Payer: Self-pay

## 2023-08-20 DIAGNOSIS — R928 Other abnormal and inconclusive findings on diagnostic imaging of breast: Secondary | ICD-10-CM | POA: Insufficient documentation

## 2023-08-26 ENCOUNTER — Ambulatory Visit: Payer: Managed Care, Other (non HMO) | Admitting: "Endocrinology

## 2023-09-24 ENCOUNTER — Ambulatory Visit: Payer: Managed Care, Other (non HMO) | Admitting: "Endocrinology

## 2023-10-17 ENCOUNTER — Telehealth: Payer: Self-pay | Admitting: Internal Medicine

## 2023-10-17 NOTE — Telephone Encounter (Signed)
Emma with [facility] called and asked that we send the patient another letter to get scheduled for colonoscopy.

## 2023-12-25 DIAGNOSIS — I10 Essential (primary) hypertension: Secondary | ICD-10-CM | POA: Diagnosis not present

## 2023-12-25 DIAGNOSIS — Z91199 Patient's noncompliance with other medical treatment and regimen due to unspecified reason: Secondary | ICD-10-CM | POA: Diagnosis not present

## 2023-12-25 DIAGNOSIS — E1165 Type 2 diabetes mellitus with hyperglycemia: Secondary | ICD-10-CM | POA: Diagnosis not present

## 2023-12-25 DIAGNOSIS — Z794 Long term (current) use of insulin: Secondary | ICD-10-CM | POA: Diagnosis not present

## 2023-12-25 DIAGNOSIS — E782 Mixed hyperlipidemia: Secondary | ICD-10-CM | POA: Diagnosis not present

## 2023-12-26 ENCOUNTER — Emergency Department (HOSPITAL_COMMUNITY)

## 2023-12-26 ENCOUNTER — Other Ambulatory Visit: Payer: Self-pay

## 2023-12-26 ENCOUNTER — Encounter (HOSPITAL_COMMUNITY): Payer: Self-pay

## 2023-12-26 ENCOUNTER — Emergency Department (HOSPITAL_COMMUNITY)
Admission: EM | Admit: 2023-12-26 | Discharge: 2023-12-26 | Disposition: A | Discharge status: Home / Self Care | Attending: Emergency Medicine | Admitting: Emergency Medicine

## 2023-12-26 DIAGNOSIS — N3001 Acute cystitis with hematuria: Secondary | ICD-10-CM | POA: Insufficient documentation

## 2023-12-26 DIAGNOSIS — Z7982 Long term (current) use of aspirin: Secondary | ICD-10-CM | POA: Diagnosis not present

## 2023-12-26 DIAGNOSIS — E1165 Type 2 diabetes mellitus with hyperglycemia: Secondary | ICD-10-CM | POA: Diagnosis not present

## 2023-12-26 DIAGNOSIS — Z7984 Long term (current) use of oral hypoglycemic drugs: Secondary | ICD-10-CM | POA: Diagnosis not present

## 2023-12-26 DIAGNOSIS — R531 Weakness: Secondary | ICD-10-CM | POA: Diagnosis not present

## 2023-12-26 DIAGNOSIS — R739 Hyperglycemia, unspecified: Secondary | ICD-10-CM

## 2023-12-26 DIAGNOSIS — Z79899 Other long term (current) drug therapy: Secondary | ICD-10-CM | POA: Diagnosis not present

## 2023-12-26 DIAGNOSIS — R059 Cough, unspecified: Secondary | ICD-10-CM | POA: Diagnosis not present

## 2023-12-26 LAB — URINALYSIS, ROUTINE W REFLEX MICROSCOPIC
Bilirubin Urine: NEGATIVE
Glucose, UA: >=500 mg/dL — AB
Hgb urine dipstick: NEGATIVE
Ketones, ur: NEGATIVE mg/dL
Nitrite: POSITIVE — AB
Protein, ur: NEGATIVE mg/dL
Specific Gravity, Urine: 1.026 (ref 1.005–1.030)
pH: 5 (ref 5.0–8.0)

## 2023-12-26 LAB — COMPREHENSIVE METABOLIC PANEL WITH GFR
ALT: 12 U/L (ref 0–44)
AST: 11 U/L — ABNORMAL LOW (ref 15–41)
Albumin: 3 g/dL — ABNORMAL LOW (ref 3.5–5.0)
Alkaline Phosphatase: 101 U/L (ref 38–126)
Anion gap: 10 (ref 5–15)
BUN: 15 mg/dL (ref 6–20)
CO2: 23 mmol/L (ref 22–32)
Calcium: 9.2 mg/dL (ref 8.9–10.3)
Chloride: 97 mmol/L — ABNORMAL LOW (ref 98–111)
Creatinine, Ser: 0.96 mg/dL (ref 0.44–1.00)
GFR, Estimated: >60 mL/min (ref >60)
Glucose, Bld: 480 mg/dL — ABNORMAL HIGH (ref 70–99)
Potassium: 3.8 mmol/L (ref 3.5–5.1)
Sodium: 130 mmol/L — ABNORMAL LOW (ref 135–145)
Total Bilirubin: 0.8 mg/dL (ref 0.0–1.2)
Total Protein: 7.3 g/dL (ref 6.5–8.1)

## 2023-12-26 LAB — CBC WITH DIFFERENTIAL/PLATELET
Abs Immature Granulocytes: 0.06 10*3/uL (ref 0.00–0.07)
Basophils Absolute: 0.1 10*3/uL (ref 0.0–0.1)
Basophils Relative: 0 %
Eosinophils Absolute: 0.1 10*3/uL (ref 0.0–0.5)
Eosinophils Relative: 1 %
HCT: 34.9 % — ABNORMAL LOW (ref 36.0–46.0)
Hemoglobin: 11.2 g/dL — ABNORMAL LOW (ref 12.0–15.0)
Immature Granulocytes: 1 %
Lymphocytes Relative: 22 %
Lymphs Abs: 2.5 10*3/uL (ref 0.7–4.0)
MCH: 28.5 pg (ref 26.0–34.0)
MCHC: 32.1 g/dL (ref 30.0–36.0)
MCV: 88.8 fL (ref 80.0–100.0)
Monocytes Absolute: 0.9 10*3/uL (ref 0.1–1.0)
Monocytes Relative: 8 %
Neutro Abs: 8 10*3/uL — ABNORMAL HIGH (ref 1.7–7.7)
Neutrophils Relative %: 68 %
Platelets: 326 10*3/uL (ref 150–400)
RBC: 3.93 MIL/uL (ref 3.87–5.11)
RDW: 12.3 % (ref 11.5–15.5)
WBC: 11.7 10*3/uL — ABNORMAL HIGH (ref 4.0–10.5)
nRBC: 0 % (ref 0.0–0.2)

## 2023-12-26 LAB — CBG MONITORING, ED
Glucose-Capillary: 528 mg/dL (ref 70–99)
Glucose-Capillary: 291 mg/dL — ABNORMAL HIGH (ref 70–99)

## 2023-12-26 LAB — RESP PANEL BY RT-PCR (RSV, FLU A&B, COVID)  RVPGX2
Influenza A by PCR: NEGATIVE
Influenza B by PCR: NEGATIVE
Resp Syncytial Virus by PCR: NEGATIVE
SARS Coronavirus 2 by RT PCR: NEGATIVE

## 2023-12-26 MED ORDER — INSULIN ASPART 100 UNIT/ML IJ SOLN
8.0000 [IU] | Freq: Once | INTRAMUSCULAR | Status: AC
  Administered 2023-12-26: 8 [IU] via SUBCUTANEOUS
  Filled 2023-12-26: qty 1

## 2023-12-26 MED ORDER — CEPHALEXIN 500 MG PO CAPS: 500.0000 mg | ORAL_CAPSULE | Freq: Four times a day (QID) | ORAL | 0 refills | Status: DC

## 2023-12-26 MED ORDER — SODIUM CHLORIDE 0.9 % IV BOLUS
2000.0000 mL | Freq: Once | INTRAVENOUS | Status: AC
  Administered 2023-12-26: 2000 mL via INTRAVENOUS

## 2023-12-26 MED ORDER — SODIUM CHLORIDE 0.9 % IV SOLN
2.0000 g | Freq: Once | INTRAVENOUS | Status: AC
  Administered 2023-12-26: 2 g via INTRAVENOUS
  Filled 2023-12-26: qty 20

## 2023-12-26 NOTE — ED Triage Notes (Addendum)
 Pt was sent here from her PCP for abnormal labs. Pt states she thinks her sugar was low but wasn't sure what they said. CBG 528 in triage

## 2023-12-26 NOTE — Discharge Instructions (Signed)
 Increase your nighttime insulin  so you are taking 4 more units at bedtime.  Do that for 2 days.  Follow-up with your doctor next week

## 2023-12-27 NOTE — ED Provider Notes (Signed)
 [place] EMERGENCY DEPARTMENT AT [facility] Provider Note   CSN: [id number] Arrival date & time: 12/26/23  1611     History  Chief Complaint  Patient presents with   abnormal labs    Laura Mullins is a 58 y.o. female.  Patient states that she has felt weak and her sugar has been over 500.  No other symptoms  The history is provided by the patient and medical records. No language interpreter was used.  Weakness Severity:  Moderate Onset quality:  Sudden Timing:  Constant Progression:  Waxing and waning Chronicity:  New Context: not alcohol  use   Relieved by:  Nothing Worsened by:  Nothing Associated symptoms: no abdominal pain, no chest pain, no cough, no diarrhea, no frequency, no headaches and no seizures        Home Medications Prior to Admission medications   Medication Sig Start Date End Date Taking? Authorizing Provider  albuterol (PROVENTIL HFA;VENTOLIN HFA) 108 (90 BASE) MCG/ACT inhaler Inhale 2 puffs into the lungs every 6 (six) hours as needed for wheezing or shortness of breath.   Yes [provider]  albuterol (PROVENTIL) (2.5 MG/3ML) 0.083% nebulizer solution Take 2.5 mg by nebulization every 6 (six) hours as needed for wheezing or shortness of breath.   Yes [provider]  amLODipine (NORVASC) 5 MG tablet Take 5 mg by mouth daily. 12/25/23  Yes [provider]  aspirin EC 81 MG tablet Take 81 mg by mouth daily.   Yes [provider]  cephALEXin  (KEFLEX ) 500 MG capsule Take 1 capsule (500 mg total) by mouth 4 (four) times daily. 12/26/23  Yes Lanayah Gartley, MD  gabapentin (NEURONTIN) 600 MG tablet Take 600 mg by mouth daily as needed (for neuropathy/pain).    Yes [provider]  glipiZIDE  (GLUCOTROL ) 5 MG tablet Take 5 mg by mouth daily before breakfast.   Yes [provider]  ibuprofen (ADVIL) 200 MG tablet Take 400 mg by mouth every 8 (eight) hours as needed for mild pain (pain score 1-3).  06/19/23  Yes [provider]  insulin  degludec (TRESIBA  FLEXTOUCH) 100 UNIT/ML FlexTouch Pen Inject 20 Units into the skin at bedtime. 05/17/23  Yes Nida, Jaynee Meyer, MD  lisinopril -hydrochlorothiazide  (ZESTORETIC ) 20-12.5 MG per tablet Take 1 tablet by mouth daily. 06/04/14  Yes Doc Freed, FNP  loratadine (CLARITIN) 10 MG tablet Take 10 mg by mouth daily as needed for allergies.   Yes [provider]  Magnesium  250 MG TABS Take 1 tablet (250 mg total) by mouth 2 (two) times daily with a meal. 05/17/23  Yes Nida, Gebreselassie W, MD  metFORMIN (GLUCOPHAGE) 500 MG tablet Take 500 mg by mouth 2 (two) times daily with a meal. 12/25/23  Yes [provider]  Multiple Vitamins-Calcium  (ONE-A-DAY WOMENS PO) Take 1 tablet by mouth 3 (three) times a week.   Yes [provider]  omeprazole (PRILOSEC) 40 MG capsule Take 40 mg by mouth daily.   Yes [provider]  rosuvastatin  (CRESTOR ) 10 MG tablet Take 1 tablet (10 mg total) by mouth daily. 05/17/23  Yes Nida, Gebreselassie W, MD  Blood Glucose Monitoring Suppl (ACCU-CHEK GUIDE ME) w/Device KIT 1 Piece by Does not apply route as directed. 05/17/23   Nida, Gebreselassie W, MD  Continuous Glucose Receiver (DEXCOM G7 RECEIVER) DEVI Use to test BG 4+ times daily. E11.65 07/24/23   Nida, Gebreselassie W, MD  Continuous Glucose Sensor (DEXCOM G7 SENSOR) MISC Change sensor every 10 days. Dx  E11.65 07/24/23   Baby Bolt, MD  glucose blood (ACCU-CHEK GUIDE) test strip Use to monitor glucose 4 times a day  as instructed 05/17/23   Nida, Gebreselassie W, MD  Insulin  Pen Needle 32G X 4 MM MISC Use to inject insulin  nightly 02/15/22   Nida, Gebreselassie W, MD      Allergies    Patient has no known allergies.    Review of Systems   Review of Systems  Constitutional:  Negative for appetite change and fatigue.  HENT:  Negative for congestion, ear discharge and sinus pressure.   Eyes:  Negative for  discharge.  Respiratory:  Negative for cough.   Cardiovascular:  Negative for chest pain.  Gastrointestinal:  Negative for abdominal pain and diarrhea.  Genitourinary:  Negative for frequency and hematuria.  Musculoskeletal:  Negative for back pain.  Skin:  Negative for rash.  Neurological:  Positive for weakness. Negative for seizures and headaches.  Psychiatric/Behavioral:  Negative for hallucinations.     Physical Exam Updated Vital Signs BP 105/78   Pulse 90   Temp 98.6 F (37 C) (Oral)   Resp 16   Ht 5\' 7"  (1.702 m)   Wt 68.5 kg   LMP 06/02/2016   SpO2 96%   BMI 23.65 kg/m  Physical Exam Vitals and nursing note reviewed.  Constitutional:      Appearance: She is well-developed.  HENT:     Head: Normocephalic.     Nose: Nose normal.  Eyes:     General: No scleral icterus.    Conjunctiva/sclera: Conjunctivae normal.  Neck:     Thyroid : No thyromegaly.  Cardiovascular:     Rate and Rhythm: Normal rate and regular rhythm.     Heart sounds: No murmur heard.    No friction rub. No gallop.  Pulmonary:     Breath sounds: No stridor. No wheezing or rales.  Chest:     Chest wall: No tenderness.  Abdominal:     General: There is no distension.     Tenderness: There is no abdominal tenderness. There is no rebound.  Musculoskeletal:        General: Normal range of motion.     Cervical back: Neck supple.  Lymphadenopathy:     Cervical: No cervical adenopathy.  Skin:    Findings: No erythema or rash.  Neurological:     Mental Status: She is alert and oriented to person, place, and time.     Motor: No abnormal muscle tone.     Coordination: Coordination normal.  Psychiatric:        Behavior: Behavior normal.     ED Results / Procedures / Treatments   Labs (all labs ordered are listed, but only abnormal results are displayed) Labs Reviewed  CBC WITH DIFFERENTIAL/PLATELET - Abnormal; Notable for the following components:      Result Value   WBC 11.7 (*)     Hemoglobin 11.2 (*)    HCT 34.9 (*)    Neutro Abs 8.0 (*)    All other components within normal limits  COMPREHENSIVE METABOLIC PANEL WITH GFR - Abnormal; Notable for the following components:   Sodium 130 (*)    Chloride 97 (*)    Glucose, Bld 480 (*)    Albumin 3.0 (*)    AST 11 (*)    All other components within normal limits  URINALYSIS, ROUTINE W REFLEX MICROSCOPIC - Abnormal; Notable for the following components:   Color, Urine STRAW (*)    APPearance  HAZY (*)    Glucose, UA >=500 (*)    Nitrite POSITIVE (*)    Leukocytes,Ua MODERATE (*)    Bacteria, UA FEW (*)    All other components within normal limits  CBG MONITORING, ED - Abnormal; Notable for the following components:   Glucose-Capillary 528 (*)    All other components within normal limits  CBG MONITORING, ED - Abnormal; Notable for the following components:   Glucose-Capillary 291 (*)    All other components within normal limits  RESP PANEL BY RT-PCR (RSV, FLU A&B, COVID)  [id number]  URINE CULTURE    EKG None  Radiology DG Chest Port 1 View Result Date: 12/26/2023 CLINICAL DATA:  Cough EXAM: PORTABLE CHEST 1 VIEW COMPARISON:  Chest x-ray 02/24/2015 FINDINGS: The heart size and mediastinal contours are within normal limits. Both lungs are clear. The visualized skeletal structures are unremarkable. IMPRESSION: No active disease. Electronically Signed   By: Tyron Gallon M.D.   On: 12/26/2023 18:45    Procedures Procedures    Medications Ordered in ED Medications  sodium chloride  0.9 % bolus 2,000 mL (0 mLs Intravenous Stopped 12/26/23 2321)  insulin  aspart (novoLOG ) injection 8 Units (8 Units Subcutaneous Given 12/26/23 1955)  cefTRIAXone  (ROCEPHIN ) 2 g in sodium chloride  0.9 % 100 mL IVPB (0 g Intravenous Stopped 12/26/23 2249)    ED Course/ Medical Decision Making/ A&P  Patient with urinary tract infection and hyperglycemia.  She improved with insulin  and fluids.  We will start her on antibiotics and increase  her nighttime insulin  dose to 4 units for couple days.  She will follow-up with PCP                               Medical Decision Making Amount and/or Complexity of Data Reviewed Labs: ordered. Radiology: ordered.  Risk Prescription drug management.   Poorly controlled diabetes and urinary tract infection.  Patient was given Rocephin  and will be sent home with Keflex         Final Clinical Impression(s) / ED Diagnoses Final diagnoses:  Acute cystitis with hematuria  Hyperglycemia    Rx / DC Orders ED Discharge Orders          Ordered    cephALEXin  (KEFLEX ) 500 MG capsule  4 times daily        12/26/23 2309              Cheyenne Cotta, MD 12/27/23 1324

## 2023-12-29 LAB — URINE CULTURE: Culture: >=100000 — AB

## 2023-12-30 ENCOUNTER — Telehealth (HOSPITAL_BASED_OUTPATIENT_CLINIC_OR_DEPARTMENT_OTHER): Payer: Self-pay | Admitting: *Deleted

## 2023-12-30 NOTE — Telephone Encounter (Signed)
 Post ED Visit - Positive Culture Follow-up  Culture report reviewed by antimicrobial stewardship pharmacist: [facility] []  Lorice Roof.D. []  Skeet Duke, Pharm.D., BCPS AQ-ID []  Leslee Rase, Pharm.D., BCPS []  Garland Junk, [address].D., BCPS []  [place], [address].D., BCPS, AAHIVP []  Alcide Aly, Pharm.D., BCPS, AAHIVP []  Jerri Morale, PharmD, BCPS []  Graham Laws, PharmD, BCPS []  Cleda Curly, PharmD, BCPS []  Tamar Fairly, PharmD []  Ballard Levels, PharmD, BCPS []  Ollen Beverage, PharmD  [facility] []  Arlyne Bering, PharmD []  Sherryle Don, PharmD []  Van Gelinas, PharmD []  Delila Felty, Rph []  [place]) Cleora Daft, PharmD []  Augustina Block, PharmD []  Arie Kurtz, PharmD []  Sharlyn Deaner, PharmD []  Agnes Hose, PharmD []  Kendall Pauls, PharmD []  Gladstone Lamer, PharmD []  Armanda Bern, PharmD []  Tera Fellows, PharmD   Positive Urine culture Treated with cephalexin , organism sensitive to the same and no further patient follow-up is required at this time.  Lovell Rubenstein Sharion Davidson 12/30/2023, 8:24 AM

## 2024-01-02 ENCOUNTER — Telehealth: Payer: Self-pay | Admitting: "Endocrinology

## 2024-01-02 ENCOUNTER — Other Ambulatory Visit: Payer: Self-pay | Admitting: Nurse Practitioner

## 2024-01-02 DIAGNOSIS — I10 Essential (primary) hypertension: Secondary | ICD-10-CM | POA: Diagnosis not present

## 2024-01-02 DIAGNOSIS — Z91199 Patient's noncompliance with other medical treatment and regimen due to unspecified reason: Secondary | ICD-10-CM | POA: Diagnosis not present

## 2024-01-02 DIAGNOSIS — E1165 Type 2 diabetes mellitus with hyperglycemia: Secondary | ICD-10-CM | POA: Diagnosis not present

## 2024-01-02 DIAGNOSIS — N390 Urinary tract infection, site not specified: Secondary | ICD-10-CM | POA: Diagnosis not present

## 2024-01-02 DIAGNOSIS — Z794 Long term (current) use of insulin: Secondary | ICD-10-CM | POA: Diagnosis not present

## 2024-01-02 MED ORDER — ACCU-CHEK GUIDE TEST VI STRP: ORAL_STRIP | 12 refills | Status: DC

## 2024-01-02 MED ORDER — ACCU-CHEK SOFTCLIX LANCETS MISC: 12 refills | Status: DC

## 2024-01-02 MED ORDER — ACCU-CHEK GUIDE ME W/DEVICE KIT: 1.0000 | PACK | 0 refills | Status: AC

## 2024-01-02 MED ORDER — ACCU-CHEK GUIDE ME W/DEVICE KIT: 1.0000 | PACK | 0 refills | Status: DC

## 2024-01-02 NOTE — Telephone Encounter (Signed)
 Can you send in a RX for meter, strips lancets, Dr Eldon Greenland said she has to get back in to get see Dr [place]. [facility]

## 2024-01-02 NOTE — Telephone Encounter (Signed)
 done

## 2024-01-21 ENCOUNTER — Encounter: Payer: Self-pay | Admitting: "Endocrinology

## 2024-01-21 ENCOUNTER — Ambulatory Visit (INDEPENDENT_AMBULATORY_CARE_PROVIDER_SITE_OTHER): Admitting: "Endocrinology

## 2024-01-21 VITALS — BP 124/82 | HR 92 | Ht 67.0 in | Wt 160.4 lb

## 2024-01-21 DIAGNOSIS — E782 Mixed hyperlipidemia: Secondary | ICD-10-CM

## 2024-01-21 DIAGNOSIS — I10 Essential (primary) hypertension: Secondary | ICD-10-CM | POA: Diagnosis not present

## 2024-01-21 DIAGNOSIS — E1165 Type 2 diabetes mellitus with hyperglycemia: Secondary | ICD-10-CM | POA: Diagnosis not present

## 2024-01-21 DIAGNOSIS — Z91199 Patient's noncompliance with other medical treatment and regimen due to unspecified reason: Secondary | ICD-10-CM | POA: Diagnosis not present

## 2024-01-21 DIAGNOSIS — Z794 Long term (current) use of insulin: Secondary | ICD-10-CM | POA: Diagnosis not present

## 2024-01-21 MED ORDER — TRESIBA FLEXTOUCH 100 UNIT/ML ~~LOC~~ SOPN: 30.0000 [IU] | PEN_INJECTOR | Freq: Every day | SUBCUTANEOUS | 1 refills | Status: DC

## 2024-01-21 NOTE — Patient Instructions (Signed)

## 2024-01-21 NOTE — Progress Notes (Signed)
 01/21/2024, 12:38 PM  Endocrinology follow-up note   Subjective:    Patient ID: Laura Mullins, female    DOB: Dec 27, 1965.  Laura Mullins is being seen in follow-up after she was seen in consultation for management of currently uncontrolled symptomatic diabetes requested by  Wyvonna Heidelberg, MD.   Past Medical History:  Diagnosis Date   Arthritis    Asthma    Diabetes mellitus without complication (HCC)    GERD (gastroesophageal reflux disease)    Hyperlipidemia    Hypertension    Peptic ulcer    Premenopause menorrhagia    Trichimoniasis    Trichimoniasis 11/25/2020   11/25/20 treated    Past Surgical History:  Procedure Laterality Date   BACK SURGERY     CHOLECYSTECTOMY      Social History   Socioeconomic History   Marital status: Married    Spouse name: Not on file   Number of children: Not on file   Years of education: Not on file   Highest education level: Not on file  Occupational History   Not on file  Tobacco Use   Smoking status: Never   Smokeless tobacco: Never  Vaping Use   Vaping status: Never Used  Substance and Sexual Activity   Alcohol  use: No   Drug use: No   Sexual activity: Not Currently    Birth control/protection: Post-menopausal  Other Topics Concern   Not on file  Social History Narrative   Not on file   Social Drivers of Health   Financial Resource Strain: Low Risk  (07/11/2023)   Overall Financial Resource Strain (CARDIA)    Difficulty of Paying Living Expenses: Not hard at all  Food Insecurity: No Food Insecurity (07/11/2023)   Hunger Vital Sign    Worried About Running Out of Food in the Last Year: Never true    Ran Out of Food in the Last Year: Never true  Transportation Needs: No Transportation Needs (07/11/2023)   PRAPARE - Administrator, Civil Service (Medical): No    Lack of Transportation (Non-Medical): No  Physical  Activity: Inactive (07/11/2023)   Exercise Vital Sign    Days of Exercise per Week: 0 days    Minutes of Exercise per Session: 0 min  Stress: No Stress Concern Present (07/11/2023)   [employer] of Occupational Health - Occupational Stress Questionnaire    Feeling of Stress : Not at all  Social Connections: Socially Integrated (07/11/2023)   Social Connection and Isolation Panel [NHANES]    Frequency of Communication with Friends and Family: More than three times a week    Frequency of Social Gatherings with Friends and Family: More than three times a week    Attends Religious Services: More than 4 times per year    Active Member of [employer] or Organizations: Yes    Attends Engineer, structural: More than 4 times per year    Marital Status: Married    Family History  Problem Relation Age of Onset   Hypertension Mother    Hyperlipidemia Mother    Diabetes Mother  Stroke Father    Heart disease Father    Heart attack Father    Alcohol  abuse Father    Cancer Sister        leukemia   Diabetes Sister    Hypertension Sister    Heart disease Sister    Alcohol  abuse Sister    Schizophrenia Sister    Cancer Brother    Diabetes Brother    Diabetes Maternal Grandmother    Cancer Maternal Grandmother    Alzheimer's disease Maternal Grandmother    Cancer Sister    Diabetes Sister    Cancer Sister    Diabetes Sister    Diabetes Sister    Diabetes Sister    Diabetes Sister    Diabetes Sister    Cancer Brother    Diabetes Brother    Diabetes Brother    Diabetes Brother    Diabetes Brother    Heart disease Brother    Diabetes Brother    Cancer Brother        colon   Diabetes Brother    Hyperlipidemia Brother    Diabetes Brother    Diabetes Brother     Outpatient Encounter Medications as of 01/21/2024  Medication Sig   Accu-Chek Softclix Lancets lancets Use as instructed to monitor glucose 4 times daily   albuterol (PROVENTIL HFA;VENTOLIN HFA) 108 (90 BASE)  MCG/ACT inhaler Inhale 2 puffs into the lungs every 6 (six) hours as needed for wheezing or shortness of breath.   albuterol (PROVENTIL) (2.5 MG/3ML) 0.083% nebulizer solution Take 2.5 mg by nebulization every 6 (six) hours as needed for wheezing or shortness of breath.   amLODipine (NORVASC) 5 MG tablet Take 5 mg by mouth daily.   aspirin EC 81 MG tablet Take 81 mg by mouth daily.   Blood Glucose Monitoring Suppl (ACCU-CHEK GUIDE ME) w/Device KIT 1 Piece by Does not apply route as directed.   Continuous Glucose Receiver (DEXCOM G7 RECEIVER) DEVI Use to test BG 4+ times daily. E11.65   Continuous Glucose Sensor (DEXCOM G7 SENSOR) MISC Change sensor every 10 days. Dx E11.65   gabapentin (NEURONTIN) 600 MG tablet Take 600 mg by mouth daily as needed (for neuropathy/pain).    glipiZIDE  (GLUCOTROL ) 5 MG tablet Take 5 mg by mouth daily before breakfast.   glucose blood (ACCU-CHEK GUIDE TEST) test strip Use as instructed to monitor glucose 4 times daily   glucose blood (ACCU-CHEK GUIDE) test strip Use to monitor glucose 4 times a day  as instructed   ibuprofen (ADVIL) 200 MG tablet Take 400 mg by mouth every 8 (eight) hours as needed for mild pain (pain score 1-3).   insulin  degludec (TRESIBA  FLEXTOUCH) 100 UNIT/ML FlexTouch Pen Inject 30 Units into the skin at bedtime.   Insulin  Pen Needle 32G X 4 MM MISC Use to inject insulin  nightly   lisinopril -hydrochlorothiazide  (ZESTORETIC ) 20-12.5 MG per tablet Take 1 tablet by mouth daily.   loratadine (CLARITIN) 10 MG tablet Take 10 mg by mouth daily as needed for allergies.   Magnesium  250 MG TABS Take 1 tablet (250 mg total) by mouth 2 (two) times daily with a meal.   metFORMIN (GLUCOPHAGE) 500 MG tablet Take 500 mg by mouth 2 (two) times daily with a meal.   Multiple Vitamins-Calcium  (ONE-A-DAY WOMENS PO) Take 1 tablet by mouth 3 (three) times a week.   omeprazole (PRILOSEC) 40 MG capsule Take 40 mg by mouth daily.   rosuvastatin  (CRESTOR ) 10 MG tablet  Take 1 tablet (10 mg  total) by mouth daily.   [DISCONTINUED] cephALEXin  (KEFLEX ) 500 MG capsule Take 1 capsule (500 mg total) by mouth 4 (four) times daily.   [DISCONTINUED] insulin  degludec (TRESIBA  FLEXTOUCH) 100 UNIT/ML FlexTouch Pen Inject 20 Units into the skin at bedtime. (Patient taking differently: Inject 30 Units into the skin at bedtime.)   No facility-administered encounter medications on file as of 01/21/2024.    ALLERGIES: No Known Allergies  VACCINATION STATUS: Immunization History  Administered Date(s) Administered   Influenza-Unspecified 09/03/2018   Moderna Sars-Covid-2 Vaccination 11/30/2019, 12/28/2019    Diabetes She presents for her follow-up diabetic visit. She has type 2 diabetes mellitus. Onset time: She was diagnosed at approximate age of 40 years. Her disease course has been worsening (She missed her appointment since October 2024.  She returns only when she will loses control.). There are no hypoglycemic associated symptoms. Pertinent negatives for hypoglycemia include no confusion, headaches, pallor or seizures (.1a). Associated symptoms include polydipsia and polyuria. Pertinent negatives for diabetes include no chest pain, no fatigue and no polyphagia. There are no hypoglycemic complications. Symptoms are worsening. There are no diabetic complications. Risk factors for coronary artery disease include diabetes mellitus, dyslipidemia, hypertension and family history. Her weight is fluctuating minimally. She is following a generally unhealthy diet. When asked about meal planning, she reported none. She has not had a previous visit with a dietitian. Her home blood glucose trend is increasing steadily. Her breakfast blood glucose range is generally 140-180 mg/dl. Her bedtime blood glucose range is generally 140-180 mg/dl. Her overall blood glucose range is 140-180 mg/dl. (In the interval, she developed severe hyperglycemia which required ER visit.  Her A1c was found to be  greater than 14%.  Admittedly, she was not taking her metformin, glipizide  and not consistent on her insulin  prior to hospitalization.  She was resumed on her regimen and presents with a meter showing average blood glucose of 169 for the last 7 days.  She denies any hypoglycemia.      ) An ACE inhibitor/angiotensin II receptor blocker is being taken.  Hyperlipidemia This is a chronic problem. The current episode started more than 1 year ago. The problem is uncontrolled. Exacerbating diseases include diabetes. Pertinent negatives include no chest pain, myalgias or shortness of breath. Current antihyperlipidemic treatment includes statins. Risk factors for coronary artery disease include dyslipidemia, diabetes mellitus, hypertension, a sedentary lifestyle and family history.  Hypertension This is a chronic problem. The problem is controlled. Pertinent negatives include no chest pain, headaches, palpitations or shortness of breath. Risk factors for coronary artery disease include dyslipidemia, diabetes mellitus and family history. Past treatments include ACE inhibitors and diuretics.     Objective:       01/21/2024   10:20 AM 12/26/2023    9:00 PM 12/26/2023    8:00 PM  Vitals with BMI  Height 5\' 7"     Weight 160 lbs 6 oz    BMI 25.12    Systolic 124 105 161  Diastolic 82 78 86  Pulse 92 90 100    BP 124/82   Pulse 92   Ht 5\' 7"  (1.702 m)   Wt 160 lb 6.4 oz (72.8 kg)   LMP 06/02/2016   BMI 25.12 kg/m   Wt Readings from Last 3 Encounters:  01/21/24 160 lb 6.4 oz (72.8 kg)  12/26/23 151 lb (68.5 kg)  07/11/23 165 lb (74.8 kg)       CMP ( most recent) CMP     Component Value Date/Time  NA 130 (L) 12/26/2023 1750   NA 131 (L) 05/07/2023 1103   K 3.8 12/26/2023 1750   CL 97 (L) 12/26/2023 1750   CO2 23 12/26/2023 1750   GLUCOSE 480 (H) 12/26/2023 1750   BUN 15 12/26/2023 1750   BUN 12 05/07/2023 1103   CREATININE 0.96 12/26/2023 1750   CREATININE 0.69 08/12/2020 1045    CALCIUM  9.2 12/26/2023 1750   PROT 7.3 12/26/2023 1750   PROT 7.3 05/07/2023 1103   ALBUMIN 3.0 (L) 12/26/2023 1750   ALBUMIN 4.1 05/07/2023 1103   AST 11 (L) 12/26/2023 1750   ALT 12 12/26/2023 1750   ALKPHOS 101 12/26/2023 1750   BILITOT 0.8 12/26/2023 1750   BILITOT 0.4 05/07/2023 1103   GFRNONAA >60 12/26/2023 1750   GFRNONAA 99 08/12/2020 1045   GFRAA 114 08/12/2020 1045     Diabetic Labs (most recent): Lab Results  Component Value Date   HGBA1C 7.3 (A) 06/04/2022   HGBA1C 10.2 (A) 02/08/2022   HGBA1C 7.2 (A) 04/27/2021   MICROALBUR 80 12/14/2020   MICROALBUR 30 08/16/2020   Lipid Panel     Component Value Date/Time   CHOL 271 (H) 05/07/2023 1103   TRIG 163 (H) 05/07/2023 1103   HDL 43 05/07/2023 1103   CHOLHDL 6.3 (H) 05/07/2023 1103   CHOLHDL 5.8 (H) 08/12/2020 1045   LDLCALC 197 (H) 05/07/2023 1103   LDLCALC 173 (H) 08/12/2020 1045   LABVLDL 31 05/07/2023 1103     Assessment & Plan:   1. Type 2 diabetes mellitus with hyperglycemia, with long-term current use of insulin  (HCC)  - Laura Mullins has currently uncontrolled symptomatic type 2 DM since  58 years of age.  In the interval, she developed severe hyperglycemia which required ER visit.  Her A1c was found to be greater than 14%.  Admittedly, she was not taking her metformin, glipizide  and not consistent on her insulin  prior to hospitalization.  She was resumed on her regimen and presents with a meter showing average blood glucose of 169 for the last 7 days.  She denies any hypoglycemia.    - I had a long discussion with her about the progressive nature of diabetes and the pathology behind its complications. -her diabetes is complicated by peripheral arterial disease, peripheral neuropathy and she remains at exceedingly  high risk for more acute and chronic complications which include CAD, CVA, CKD, retinopathy, and neuropathy. These are all discussed in detail with her.  - I have counseled her on diet   and weight management  by adopting a carbohydrate restricted/protein rich diet. Patient is encouraged to switch to  unprocessed or minimally processed     complex starch and increased protein intake mostly plant source, fruits, and vegetables.   - she acknowledges that there is a room for improvement in her food and drink choices. - Suggestion is made for her to avoid simple carbohydrates  from her diet including Cakes, Sweet Desserts, Ice Cream, Soda (diet and regular), Sweet Tea, Candies, Chips, Cookies, Store Bought Juices, Alcohol  , Artificial Sweeteners,  Coffee Creamer, and "Sugar-free" Products, Lemonade. This will help patient to have more stable blood glucose profile and potentially avoid unintended weight gain.  The following Lifestyle Medicine recommendations according to American College of Lifestyle Medicine  [facility]) were discussed and and offered to patient and she  agrees to start the journey:  A. Whole Foods, Plant-Based Nutrition comprising of fruits and vegetables, plant-based proteins, whole-grain carbohydrates was discussed in detail with the  patient.   A list for source of those nutrients were also provided to the patient.  Patient will use only water or unsweetened tea for hydration. B.  The need to stay away from risky substances including alcohol , smoking; obtaining 7 to 9 hours of restorative sleep, at least 150 minutes of moderate intensity exercise weekly, the importance of healthy social connections,  and stress management techniques were discussed. C.  A full color page of  Calorie density of various food groups per pound showing examples of each food groups was provided to the patient.    - she will continue follow-up with  Penny Crumpton, RDN, CDE for diabetes education.  - I have approached her with the following individualized plan to manage  her diabetes and patient agrees:   - Despite her recent hyperglycemic crisis, resumption of her usual regimen helped her  achieve near target glycemic profile in the last 7 days averaging 169 mg per DL.   She does not have optimal engagement to consider multiple daily injections of insulin  at this time.  She is encouraged to stay on her basal insulin  along with metformin and glipizide . -I advised her to increase her Tresiba  to 30 units nightly associated with monitoring of blood glucose 4 times a day-before meals and at bedtime. - She is encouraged to call clinic for hypoglycemia below 70 or hyperglycemia above 200 mg per DL.   -This patient would have benefited from a CGM.  She did not fill the Dexcom G7 prescription filled out for her during her last visit.    - She is advised to continue metformin 500 mg p.o. twice daily with breakfast and supper. - She is advised to continue glipizide  5 mg XL p.o. daily at breakfast.   - She did not afford the co-pay for Jardiance .  - Specific targets for  A1c;  LDL, HDL,  and Triglycerides were discussed with the patient.  2) Blood Pressure /Hypertension:  Her blood pressure is controlled to target.  She has mild microalbuminuria. she is advised to continue her current medications including lisinopril /HCTZ 20/12.5 mg p.o. daily.   3) Lipids/Hyperlipidemia: Her recent lipid panel showed uncontrolled LDL at  197.  She does not have recent lipid panel.  She is advised to continue and be consistent with Crestor  10 mg p.o. nightly.   She is at exceedingly high risk for cardiovascular disease.  Side effects and precautions discussed with her.     4)  Weight/Diet:  Body mass index is 25.12 kg/m.  -She is reversing her catabolic phase, presents with 5 pounds of weight gain.  This is a good development for her.  She is not a candidate for weight loss.   This is helping her diabetes and high blood pressure.  she is not  a candidate for any more major weight loss.  Exercise, and detailed carbohydrates information provided  -  detailed on discharge instructions.  5) Chronic Care/Health  Maintenance:  -she  Is on ACEI/ARB and Statin medications and  is encouraged to initiate and continue to follow up with Ophthalmology, Dentist,  Podiatrist at least yearly or according to recommendations, and advised to   stay away from smoking. I have recommended yearly flu vaccine and pneumonia vaccine at least every 5 years; moderate intensity exercise for up to 150 minutes weekly; and  sleep for at least 7 hours a day.  Her screening ABI was negative for PAD in December 2021.  This study will be repeated in 2026 or  sooner if needed.    - she is  advised to maintain close follow up with Fanta, Tesfaye Demissie, MD for primary care needs, as well as her other providers for optimal and coordinated care.   I spent  41  minutes in the care of the patient today including review of labs from CMP, Lipids, Thyroid  Function, Hematology (current and previous including abstractions from other facilities); face-to-face time discussing  her blood glucose readings/logs, discussing hypoglycemia and hyperglycemia episodes and symptoms, medications doses, her options of short and long term treatment based on the latest standards of care / guidelines;  discussion about incorporating lifestyle medicine;  and documenting the encounter. Risk reduction counseling performed per USPSTF guidelines to reduce cardiovascular risk factors.     Please refer to Patient Instructions for Blood Glucose Monitoring and Insulin /Medications Dosing Guide"  in media tab for additional information. Please  also refer to " Patient Self Inventory" in the Media  tab for reviewed elements of pertinent patient history.  Laura Mullins participated in the discussions, expressed understanding, and voiced agreement with the above plans.  All questions were answered to her satisfaction. she is encouraged to contact clinic should she have any questions or concerns prior to her return visit.   Follow up plan: - Return in about 4 weeks (around  02/18/2024) for F/U with Meter/CGM Rommie Coats Only - no Labs.  Kalvin Orf, MD [facility] [address] [place], [place] [address] Phone: [phone]  Fax: [phone]    01/21/2024, 12:38 PM  This note was partially dictated with voice recognition software. Similar sounding words can be transcribed inadequately or may not  be corrected upon review.

## 2024-01-24 DIAGNOSIS — E1165 Type 2 diabetes mellitus with hyperglycemia: Secondary | ICD-10-CM | POA: Diagnosis not present

## 2024-01-24 DIAGNOSIS — I10 Essential (primary) hypertension: Secondary | ICD-10-CM | POA: Diagnosis not present

## 2024-01-24 DIAGNOSIS — Z794 Long term (current) use of insulin: Secondary | ICD-10-CM | POA: Diagnosis not present

## 2024-01-24 DIAGNOSIS — E782 Mixed hyperlipidemia: Secondary | ICD-10-CM | POA: Diagnosis not present

## 2024-01-29 NOTE — Progress Notes (Signed)
 Laura Mullins

## 2024-02-12 ENCOUNTER — Telehealth: Payer: Self-pay | Admitting: Nurse Practitioner

## 2024-02-12 DIAGNOSIS — E1165 Type 2 diabetes mellitus with hyperglycemia: Secondary | ICD-10-CM

## 2024-02-12 NOTE — Telephone Encounter (Signed)
 Pt said she needs some pen needles for her Tresiba  - [facility].

## 2024-02-13 ENCOUNTER — Other Ambulatory Visit: Payer: Self-pay | Admitting: Adult Health

## 2024-02-13 MED ORDER — INSULIN PEN NEEDLE 32G X 4 MM MISC: 1 refills | Status: DC

## 2024-02-13 NOTE — Telephone Encounter (Signed)
Rx for insulin pen needles sent to pharmacy

## 2024-02-18 ENCOUNTER — Encounter: Payer: Self-pay | Admitting: "Endocrinology

## 2024-02-18 ENCOUNTER — Ambulatory Visit (INDEPENDENT_AMBULATORY_CARE_PROVIDER_SITE_OTHER): Admitting: "Endocrinology

## 2024-02-18 VITALS — BP 110/66 | HR 104 | Ht 67.0 in | Wt 163.8 lb

## 2024-02-18 DIAGNOSIS — Z794 Long term (current) use of insulin: Secondary | ICD-10-CM

## 2024-02-18 DIAGNOSIS — E1165 Type 2 diabetes mellitus with hyperglycemia: Secondary | ICD-10-CM | POA: Diagnosis not present

## 2024-02-18 DIAGNOSIS — I10 Essential (primary) hypertension: Secondary | ICD-10-CM

## 2024-02-18 DIAGNOSIS — E782 Mixed hyperlipidemia: Secondary | ICD-10-CM

## 2024-02-18 LAB — POCT GLYCOSYLATED HEMOGLOBIN (HGB A1C): HbA1c, POC (controlled diabetic range): 8.4 % — AB (ref 0.0–7.0)

## 2024-02-18 MED ORDER — INSULIN PEN NEEDLE 32G X 4 MM MISC: 1 refills | Status: DC

## 2024-02-18 MED ORDER — TRESIBA FLEXTOUCH 100 UNIT/ML ~~LOC~~ SOPN: 40.0000 [IU] | PEN_INJECTOR | Freq: Every day | SUBCUTANEOUS | 1 refills | Status: AC

## 2024-02-18 MED ORDER — ACCU-CHEK SOFTCLIX LANCETS MISC: 3 refills | Status: AC

## 2024-02-18 NOTE — Patient Instructions (Signed)

## 2024-02-18 NOTE — Progress Notes (Signed)
 02/18/2024, 3:08 PM  Endocrinology follow-up note   Subjective:    Patient ID: Laura Mullins, female    DOB: 19-Apr-1966.  Laura Mullins is being seen in follow-up after she was seen in consultation for management of currently uncontrolled symptomatic diabetes requested by  Wyvonna Heidelberg, MD.   Past Medical History:  Diagnosis Date   Arthritis    Asthma    Diabetes mellitus without complication (HCC)    GERD (gastroesophageal reflux disease)    Hyperlipidemia    Hypertension    Peptic ulcer    Premenopause menorrhagia    Trichimoniasis    Trichimoniasis 11/25/2020   11/25/20 treated    Past Surgical History:  Procedure Laterality Date   BACK SURGERY     CHOLECYSTECTOMY      Social History   Socioeconomic History   Marital status: Married    Spouse name: Not on file   Number of children: Not on file   Years of education: Not on file   Highest education level: Not on file  Occupational History   Not on file  Tobacco Use   Smoking status: Never   Smokeless tobacco: Never  Vaping Use   Vaping status: Never Used  Substance and Sexual Activity   Alcohol  use: No   Drug use: No   Sexual activity: Not Currently    Birth control/protection: Post-menopausal  Other Topics Concern   Not on file  Social History Narrative   Not on file   Social Drivers of Health   Financial Resource Strain: Low Risk  (07/11/2023)   Overall Financial Resource Strain (CARDIA)    Difficulty of Paying Living Expenses: Not hard at all  Food Insecurity: No Food Insecurity (07/11/2023)   Hunger Vital Sign    Worried About Running Out of Food in the Last Year: Never true    Ran Out of Food in the Last Year: Never true  Transportation Needs: No Transportation Needs (07/11/2023)   PRAPARE - Administrator, Civil Service (Medical): No    Lack of Transportation (Non-Medical): No  Physical  Activity: Inactive (07/11/2023)   Exercise Vital Sign    Days of Exercise per Week: 0 days    Minutes of Exercise per Session: 0 min  Stress: No Stress Concern Present (07/11/2023)   [employer] of Occupational Health - Occupational Stress Questionnaire    Feeling of Stress : Not at all  Social Connections: Socially Integrated (07/11/2023)   Social Connection and Isolation Panel    Frequency of Communication with Friends and Family: More than three times a week    Frequency of Social Gatherings with Friends and Family: More than three times a week    Attends Religious Services: More than 4 times per year    Active Member of [employer] or Organizations: Yes    Attends Engineer, structural: More than 4 times per year    Marital Status: Married    Family History  Problem Relation Age of Onset   Hypertension Mother    Hyperlipidemia Mother    Diabetes Mother    Stroke  Father    Heart disease Father    Heart attack Father    Alcohol  abuse Father    Cancer Sister        leukemia   Diabetes Sister    Hypertension Sister    Heart disease Sister    Alcohol  abuse Sister    Schizophrenia Sister    Cancer Brother    Diabetes Brother    Diabetes Maternal Grandmother    Cancer Maternal Grandmother    Alzheimer's disease Maternal Grandmother    Cancer Sister    Diabetes Sister    Cancer Sister    Diabetes Sister    Diabetes Sister    Diabetes Sister    Diabetes Sister    Diabetes Sister    Cancer Brother    Diabetes Brother    Diabetes Brother    Diabetes Brother    Diabetes Brother    Heart disease Brother    Diabetes Brother    Cancer Brother        colon   Diabetes Brother    Hyperlipidemia Brother    Diabetes Brother    Diabetes Brother     Outpatient Encounter Medications as of 02/18/2024  Medication Sig   Accu-Chek Softclix Lancets lancets Use as instructed to monitor glucose 4 times daily   albuterol (PROVENTIL HFA;VENTOLIN HFA) 108 (90 BASE) MCG/ACT  inhaler Inhale 2 puffs into the lungs every 6 (six) hours as needed for wheezing or shortness of breath.   albuterol (PROVENTIL) (2.5 MG/3ML) 0.083% nebulizer solution Take 2.5 mg by nebulization every 6 (six) hours as needed for wheezing or shortness of breath.   amLODipine (NORVASC) 5 MG tablet Take 5 mg by mouth daily.   aspirin EC 81 MG tablet Take 81 mg by mouth daily.   Blood Glucose Monitoring Suppl (ACCU-CHEK GUIDE ME) w/Device KIT 1 Piece by Does not apply route as directed.   Continuous Glucose Receiver (DEXCOM G7 RECEIVER) DEVI Use to test BG 4+ times daily. E11.65   Continuous Glucose Sensor (DEXCOM G7 SENSOR) MISC Change sensor every 10 days. Dx E11.65   gabapentin (NEURONTIN) 600 MG tablet Take 600 mg by mouth daily as needed (for neuropathy/pain).    glipiZIDE  (GLUCOTROL ) 5 MG tablet Take 5 mg by mouth daily before breakfast.   glucose blood (ACCU-CHEK GUIDE TEST) test strip Use as instructed to monitor glucose 4 times daily   glucose blood (ACCU-CHEK GUIDE) test strip Use to monitor glucose 4 times a day  as instructed   ibuprofen (ADVIL) 200 MG tablet Take 400 mg by mouth every 8 (eight) hours as needed for mild pain (pain score 1-3).   insulin  degludec (TRESIBA  FLEXTOUCH) 100 UNIT/ML FlexTouch Pen Inject 40 Units into the skin at bedtime.   Insulin  Pen Needle 32G X 4 MM MISC Use to inject insulin  nightly   lisinopril -hydrochlorothiazide  (ZESTORETIC ) 20-12.5 MG per tablet Take 1 tablet by mouth daily.   loratadine (CLARITIN) 10 MG tablet Take 10 mg by mouth daily as needed for allergies.   Magnesium  250 MG TABS Take 1 tablet (250 mg total) by mouth 2 (two) times daily with a meal.   metFORMIN (GLUCOPHAGE) 500 MG tablet Take 500 mg by mouth 2 (two) times daily with a meal.   Multiple Vitamins-Calcium  (ONE-A-DAY WOMENS PO) Take 1 tablet by mouth 3 (three) times a week.   nystatin -triamcinolone  ointment (MYCOLOG) APPLY TO AFFECTED AREA TWICE A DAY   omeprazole (PRILOSEC) 40 MG  capsule Take 40 mg by mouth daily.  rosuvastatin  (CRESTOR ) 10 MG tablet Take 1 tablet (10 mg total) by mouth daily.   [DISCONTINUED] Accu-Chek Softclix Lancets lancets Use as instructed to monitor glucose 4 times daily   [DISCONTINUED] insulin  degludec (TRESIBA  FLEXTOUCH) 100 UNIT/ML FlexTouch Pen Inject 30 Units into the skin at bedtime.   [DISCONTINUED] Insulin  Pen Needle 32G X 4 MM MISC Use to inject insulin  nightly   No facility-administered encounter medications on file as of 02/18/2024.    ALLERGIES: No Known Allergies  VACCINATION STATUS: Immunization History  Administered Date(s) Administered   Influenza-Unspecified 09/03/2018   Moderna Sars-Covid-2 Vaccination 11/30/2019, 12/28/2019    Diabetes She presents for her follow-up diabetic visit. She has type 2 diabetes mellitus. Onset time: She was diagnosed at approximate age of 40 years. Her disease course has been improving (She missed her appointment since October 2024.  She returns only when she will loses control.). There are no hypoglycemic associated symptoms. Pertinent negatives for hypoglycemia include no confusion, headaches, pallor or seizures (.1a). Pertinent negatives for diabetes include no chest pain, no fatigue, no polydipsia, no polyphagia and no polyuria. There are no hypoglycemic complications. Symptoms are improving. There are no diabetic complications. Risk factors for coronary artery disease include diabetes mellitus, dyslipidemia, hypertension and family history. Her weight is increasing steadily. She is following a generally unhealthy diet. When asked about meal planning, she reported none. She has not had a previous visit with a dietitian. Her home blood glucose trend is decreasing steadily. Her breakfast blood glucose range is generally 140-180 mg/dl. Her bedtime blood glucose range is generally 180-200 mg/dl. Her overall blood glucose range is 180-200 mg/dl. (She presents with significantly improved glycemic  profile.  She was put on basal insulin .  She did not document hypoglycemia.  She still has readings above target at fasting and postprandial.       ) An ACE inhibitor/angiotensin II receptor blocker is being taken.  Hyperlipidemia This is a chronic problem. The current episode started more than 1 year ago. The problem is uncontrolled. Exacerbating diseases include diabetes. Pertinent negatives include no chest pain, myalgias or shortness of breath. Current antihyperlipidemic treatment includes statins. Risk factors for coronary artery disease include dyslipidemia, diabetes mellitus, hypertension, a sedentary lifestyle and family history.  Hypertension This is a chronic problem. The problem is controlled. Pertinent negatives include no chest pain, headaches, palpitations or shortness of breath. Risk factors for coronary artery disease include dyslipidemia, diabetes mellitus and family history. Past treatments include ACE inhibitors and diuretics.     Objective:       02/18/2024    2:08 PM 01/21/2024   10:20 AM 12/26/2023    9:00 PM  Vitals with BMI  Height 5' 7 5' 7   Weight 163 lbs 13 oz 160 lbs 6 oz   BMI 25.65 25.12   Systolic 110 124 213  Diastolic 66 82 78  Pulse 104 92 90    BP 110/66   Pulse (!) 104   Ht 5' 7 (1.702 m)   Wt 163 lb 12.8 oz (74.3 kg)   LMP 06/02/2016   BMI 25.65 kg/m   Wt Readings from Last 3 Encounters:  02/18/24 163 lb 12.8 oz (74.3 kg)  01/21/24 160 lb 6.4 oz (72.8 kg)  12/26/23 151 lb (68.5 kg)       CMP ( most recent) CMP     Component Value Date/Time   NA 130 (L) 12/26/2023 1750   NA 131 (L) 05/07/2023 1103   K 3.8 12/26/2023 1750  CL 97 (L) 12/26/2023 1750   CO2 23 12/26/2023 1750   GLUCOSE 480 (H) 12/26/2023 1750   BUN 15 12/26/2023 1750   BUN 12 05/07/2023 1103   CREATININE 0.96 12/26/2023 1750   CREATININE 0.69 08/12/2020 1045   CALCIUM  9.2 12/26/2023 1750   PROT 7.3 12/26/2023 1750   PROT 7.3 05/07/2023 1103   ALBUMIN 3.0 (L)  12/26/2023 1750   ALBUMIN 4.1 05/07/2023 1103   AST 11 (L) 12/26/2023 1750   ALT 12 12/26/2023 1750   ALKPHOS 101 12/26/2023 1750   BILITOT 0.8 12/26/2023 1750   BILITOT 0.4 05/07/2023 1103   GFRNONAA >60 12/26/2023 1750   GFRNONAA 99 08/12/2020 1045   GFRAA 114 08/12/2020 1045     Diabetic Labs (most recent): Lab Results  Component Value Date   HGBA1C 8.4 (A) 02/18/2024   HGBA1C 7.3 (A) 06/04/2022   HGBA1C 10.2 (A) 02/08/2022   MICROALBUR 80 12/14/2020   MICROALBUR 30 08/16/2020   Lipid Panel     Component Value Date/Time   CHOL 271 (H) 05/07/2023 1103   TRIG 163 (H) 05/07/2023 1103   HDL 43 05/07/2023 1103   CHOLHDL 6.3 (H) 05/07/2023 1103   CHOLHDL 5.8 (H) 08/12/2020 1045   LDLCALC 197 (H) 05/07/2023 1103   LDLCALC 173 (H) 08/12/2020 1045   LABVLDL 31 05/07/2023 1103     Assessment & Plan:   1. Type 2 diabetes mellitus with hyperglycemia, with long-term current use of insulin  (HCC)  - Laura Mullins has currently uncontrolled symptomatic type 2 DM since  58 years of age.  She presents with significantly improved glycemic profile.  Her point-of-care A1c is 8.4% improving from 14%.  She was put on basal insulin .  She did not document hypoglycemia.  She still has readings above target at fasting and postprandial.     - I had a long discussion with her about the progressive nature of diabetes and the pathology behind its complications. -her diabetes is complicated by peripheral arterial disease, peripheral neuropathy and she remains at exceedingly  high risk for more acute and chronic complications which include CAD, CVA, CKD, retinopathy, and neuropathy. These are all discussed in detail with her.  - I have counseled her on diet  and weight management  by adopting a carbohydrate restricted/protein rich diet. Patient is encouraged to switch to  unprocessed or minimally processed     complex starch and increased protein intake mostly plant source, fruits, and  vegetables.   - she acknowledges that there is a room for improvement in her food and drink choices. - Suggestion is made for her to avoid simple carbohydrates  from her diet including Cakes, Sweet Desserts, Ice Cream, Soda (diet and regular), Sweet Tea, Candies, Chips, Cookies, Store Bought Juices, Alcohol  , Artificial Sweeteners,  Coffee Creamer, and Sugar-free Products, Lemonade. This will help patient to have more stable blood glucose profile and potentially avoid unintended weight gain.  The following Lifestyle Medicine recommendations according to American College of Lifestyle Medicine  [facility]) were discussed and and offered to patient and she  agrees to start the journey:  A. Whole Foods, Plant-Based Nutrition comprising of fruits and vegetables, plant-based proteins, whole-grain carbohydrates was discussed in detail with the patient.   A list for source of those nutrients were also provided to the patient.  Patient will use only water or unsweetened tea for hydration. B.  The need to stay away from risky substances including alcohol , smoking; obtaining 7 to 9 hours of  restorative sleep, at least 150 minutes of moderate intensity exercise weekly, the importance of healthy social connections,  and stress management techniques were discussed. C.  A full color page of  Calorie density of various food groups per pound showing examples of each food groups was provided to the patient.   - she will continue follow-up with  Melva Stabile, RDN, CDE for diabetes education.  - I have approached her with the following individualized plan to manage  her diabetes and patient agrees:   - In light of her presentation with near target glycemic profile, she will not need prandial insulin  for now. - She is advised to increase her Tresiba  to 40 units nightly to give her better control of fasting and postprandial glycemic profile.    She is encouraged to continue monitoring blood glucose using her meter  twice a day-before breakfast and bedtime.  She also received Dexcom sensor reader was given to her from clinic.  She will return for nurse visit for application of her first sensor and education.    - She is advised to continue metformin 500 mg p.o. twice daily with breakfast and supper. - She is advised to continue glipizide  5 mg XL p.o. daily at breakfast.   - She did not afford the co-pay for Jardiance .  - Specific targets for  A1c;  LDL, HDL,  and Triglycerides were discussed with the patient.  2) Blood Pressure /Hypertension:  Her blood pressure is controlled to target.  She has mild microalbuminuria. she is advised to continue her current medications including lisinopril /HCTZ 20/12.5 mg p.o. daily.   3) Lipids/Hyperlipidemia: Her recent lipid panel showed uncontrolled LDL at 197.  She is advised to continue and be consistent with Crestor  10 mg p.o. nightly.   She is at exceedingly high risk for cardiovascular disease.  Side effects and precautions discussed with her.     4)  Weight/Diet:  Body mass index is 25.65 kg/m.  -She is reversing her catabolic phase, presents with 5 pounds of weight gain.  This is a good development for her.  She is not a candidate for weight loss.   This is helping her diabetes and high blood pressure.  she is not  a candidate for any more major weight loss.  Exercise, and detailed carbohydrates information provided  -  detailed on discharge instructions.  5) Chronic Care/Health Maintenance:  -she  Is on ACEI/ARB and Statin medications and  is encouraged to initiate and continue to follow up with Ophthalmology, Dentist,  Podiatrist at least yearly or according to recommendations, and advised to   stay away from smoking. I have recommended yearly flu vaccine and pneumonia vaccine at least every 5 years; moderate intensity exercise for up to 150 minutes weekly; and  sleep for at least 7 hours a day.  Her screening ABI was negative for PAD in December 2021.  This  study will be repeated in 2026 or sooner if needed.    - she is  advised to maintain close follow up with Fanta, Tesfaye Demissie, MD for primary care needs, as well as her other providers for optimal and coordinated care.   I spent  26  minutes in the care of the patient today including review of labs from CMP, Lipids, Thyroid  Function, Hematology (current and previous including abstractions from other facilities); face-to-face time discussing  her blood glucose readings/logs, discussing hypoglycemia and hyperglycemia episodes and symptoms, medications doses, her options of short and long term treatment based on the  latest standards of care / guidelines;  discussion about incorporating lifestyle medicine;  and documenting the encounter. Risk reduction counseling performed per USPSTF guidelines to reduce  cardiovascular risk factors.     Please refer to Patient Instructions for Blood Glucose Monitoring and Insulin /Medications Dosing Guide  in media tab for additional information. Please  also refer to  Patient Self Inventory in the Media  tab for reviewed elements of pertinent patient history.  Laura Mullins participated in the discussions, expressed understanding, and voiced agreement with the above plans.  All questions were answered to her satisfaction. she is encouraged to contact clinic should she have any questions or concerns prior to her return visit.   Follow up plan: - Return in about 4 months (around 06/19/2024) for Bring Meter/CGM Device/Logs- A1c in Office.  Kalvin Orf, MD [facility] [address] [place], [place] [address] Phone: [phone]  Fax: [phone]    02/18/2024, 3:08 PM  This note was partially dictated with voice recognition software. Similar sounding words can be transcribed inadequately or may not  be corrected upon review.

## 2024-02-19 ENCOUNTER — Ambulatory Visit: Admitting: "Endocrinology

## 2024-02-19 ENCOUNTER — Ambulatory Visit (INDEPENDENT_AMBULATORY_CARE_PROVIDER_SITE_OTHER)

## 2024-02-19 DIAGNOSIS — E1165 Type 2 diabetes mellitus with hyperglycemia: Secondary | ICD-10-CM

## 2024-02-19 DIAGNOSIS — Z794 Long term (current) use of insulin: Secondary | ICD-10-CM | POA: Diagnosis not present

## 2024-02-19 NOTE — Progress Notes (Signed)
 Pt seen for Nurse's Visit for CGM instruction and application. Reviewed and set up pt's Dexcom G7 reader. Demonstrated and applied pt's Dexcom G7 sensor to R upper arm without difficulty. Discussed and answered all pt's questions. Pt voiced and demonstrated understanding.

## 2024-04-06 DIAGNOSIS — I10 Essential (primary) hypertension: Secondary | ICD-10-CM | POA: Diagnosis not present

## 2024-06-06 ENCOUNTER — Other Ambulatory Visit: Payer: Self-pay | Admitting: "Endocrinology

## 2024-06-06 DIAGNOSIS — Z794 Long term (current) use of insulin: Secondary | ICD-10-CM

## 2024-06-18 DIAGNOSIS — Z008 Encounter for other general examination: Secondary | ICD-10-CM | POA: Diagnosis not present

## 2024-06-22 ENCOUNTER — Ambulatory Visit (INDEPENDENT_AMBULATORY_CARE_PROVIDER_SITE_OTHER): Admitting: "Endocrinology

## 2024-06-22 ENCOUNTER — Encounter: Payer: Self-pay | Admitting: "Endocrinology

## 2024-06-22 VITALS — BP 118/76 | HR 96 | Ht 67.0 in | Wt 170.2 lb

## 2024-06-22 DIAGNOSIS — E782 Mixed hyperlipidemia: Secondary | ICD-10-CM | POA: Diagnosis not present

## 2024-06-22 DIAGNOSIS — I10 Essential (primary) hypertension: Secondary | ICD-10-CM

## 2024-06-22 DIAGNOSIS — E1165 Type 2 diabetes mellitus with hyperglycemia: Secondary | ICD-10-CM

## 2024-06-22 DIAGNOSIS — Z794 Long term (current) use of insulin: Secondary | ICD-10-CM

## 2024-06-22 LAB — POCT GLYCOSYLATED HEMOGLOBIN (HGB A1C): HbA1c, POC (controlled diabetic range): 6.2 % (ref 0.0–7.0)

## 2024-06-22 LAB — POCT UA - MICROALBUMIN
Albumin/Creatinine Ratio, Urine, POC: <30
Creatinine, POC: 300 mg/dL
Microalbumin Ur, POC: 30 mg/L

## 2024-06-22 NOTE — Progress Notes (Signed)
 06/22/2024, 11:13 AM  Endocrinology follow-up note   Subjective:    Patient ID: Laura Mullins, female    DOB: [birth year].  Laura Mullins is being seen in follow-up after she was seen in consultation for management of currently uncontrolled symptomatic diabetes requested by  Carlette Benita Area, MD.   Past Medical History:  Diagnosis Date   Arthritis    Asthma    Diabetes mellitus without complication (HCC)    GERD (gastroesophageal reflux disease)    Hyperlipidemia    Hypertension    Peptic ulcer    Premenopause menorrhagia    Trichimoniasis    Trichimoniasis 11/25/2020   11/25/20 treated    Past Surgical History:  Procedure Laterality Date   BACK SURGERY     CHOLECYSTECTOMY      Social History   Socioeconomic History   Marital status: Married    Spouse name: Not on file   Number of children: Not on file   Years of education: Not on file   Highest education level: Not on file  Occupational History   Not on file  Tobacco Use   Smoking status: Never   Smokeless tobacco: Never  Vaping Use   Vaping status: Never Used  Substance and Sexual Activity   Alcohol  use: No   Drug use: No   Sexual activity: Not Currently    Birth control/protection: Post-menopausal  Other Topics Concern   Not on file  Social History Narrative   Not on file   Social Drivers of Health   Financial Resource Strain: Low Risk  (07/11/2023)   Overall Financial Resource Strain (CARDIA)    Difficulty of Paying Living Expenses: Not hard at all  Food Insecurity: No Food Insecurity (07/11/2023)   Hunger Vital Sign    Worried About Running Out of Food in the Last Year: Never true    Ran Out of Food in the Last Year: Never true  Transportation Needs: No Transportation Needs (07/11/2023)   PRAPARE - Administrator, Civil Service (Medical): No    Lack of Transportation (Non-Medical): No   Physical Activity: Inactive (07/11/2023)   Exercise Vital Sign    Days of Exercise per Week: 0 days    Minutes of Exercise per Session: 0 min  Stress: No Stress Concern Present (07/11/2023)   [employer] of Occupational Health - Occupational Stress Questionnaire    Feeling of Stress : Not at all  Social Connections: Socially Integrated (07/11/2023)   Social Connection and Isolation Panel    Frequency of Communication with Friends and Family: More than three times a week    Frequency of Social Gatherings with Friends and Family: More than three times a week    Attends Religious Services: More than 4 times per year    Active Member of [employer] or Organizations: Yes    Attends Engineer, structural: More than 4 times per year    Marital Status: Married    Family History  Problem Relation Age of Onset   Hypertension Mother    Hyperlipidemia Mother    Diabetes Mother    Stroke  Father    Heart disease Father    Heart attack Father    Alcohol  abuse Father    Cancer Sister        leukemia   Diabetes Sister    Hypertension Sister    Heart disease Sister    Alcohol  abuse Sister    Schizophrenia Sister    Cancer Brother    Diabetes Brother    Diabetes Maternal Grandmother    Cancer Maternal Grandmother    Alzheimer's disease Maternal Grandmother    Cancer Sister    Diabetes Sister    Cancer Sister    Diabetes Sister    Diabetes Sister    Diabetes Sister    Diabetes Sister    Diabetes Sister    Cancer Brother    Diabetes Brother    Diabetes Brother    Diabetes Brother    Diabetes Brother    Heart disease Brother    Diabetes Brother    Cancer Brother        colon   Diabetes Brother    Hyperlipidemia Brother    Diabetes Brother    Diabetes Brother     Outpatient Encounter Medications as of 06/22/2024  Medication Sig   Accu-Chek Softclix Lancets lancets Use as instructed to monitor glucose 4 times daily   albuterol (PROVENTIL HFA;VENTOLIN HFA) 108 (90 BASE)  MCG/ACT inhaler Inhale 2 puffs into the lungs every 6 (six) hours as needed for wheezing or shortness of breath.   albuterol (PROVENTIL) (2.5 MG/3ML) 0.083% nebulizer solution Take 2.5 mg by nebulization every 6 (six) hours as needed for wheezing or shortness of breath.   amLODipine (NORVASC) 5 MG tablet Take 5 mg by mouth daily.   aspirin EC 81 MG tablet Take 81 mg by mouth daily.   Blood Glucose Monitoring Suppl (ACCU-CHEK GUIDE ME) w/Device KIT 1 Piece by Does not apply route as directed.   Continuous Glucose Receiver (DEXCOM G7 RECEIVER) DEVI Use to test BG 4+ times daily. E11.65   Continuous Glucose Sensor (DEXCOM G7 SENSOR) MISC CHANGE SENSOR EVERY 10 DAYS. DX E11.65   gabapentin (NEURONTIN) 600 MG tablet Take 600 mg by mouth daily as needed (for neuropathy/pain).    glipiZIDE  (GLUCOTROL ) 5 MG tablet Take 5 mg by mouth daily before breakfast.   glucose blood (ACCU-CHEK GUIDE TEST) test strip Use as instructed to monitor glucose 4 times daily   glucose blood (ACCU-CHEK GUIDE) test strip Use to monitor glucose 4 times a day  as instructed   ibuprofen (ADVIL) 200 MG tablet Take 400 mg by mouth every 8 (eight) hours as needed for mild pain (pain score 1-3).   insulin  degludec (TRESIBA  FLEXTOUCH) 100 UNIT/ML FlexTouch Pen Inject 40 Units into the skin at bedtime.   Insulin  Pen Needle 32G X 4 MM MISC Use to inject insulin  nightly   lisinopril -hydrochlorothiazide  (ZESTORETIC ) 20-12.5 MG per tablet Take 1 tablet by mouth daily.   loratadine (CLARITIN) 10 MG tablet Take 10 mg by mouth daily as needed for allergies.   Magnesium  250 MG TABS Take 1 tablet (250 mg total) by mouth 2 (two) times daily with a meal.   metFORMIN (GLUCOPHAGE) 500 MG tablet Take 500 mg by mouth 2 (two) times daily with a meal.   Multiple Vitamins-Calcium  (ONE-A-DAY WOMENS PO) Take 1 tablet by mouth 3 (three) times a week.   nystatin -triamcinolone  ointment (MYCOLOG) APPLY TO AFFECTED AREA TWICE A DAY   omeprazole (PRILOSEC) 40  MG capsule Take 40 mg by mouth daily.  rosuvastatin  (CRESTOR ) 10 MG tablet Take 1 tablet (10 mg total) by mouth daily.   No facility-administered encounter medications on file as of 06/22/2024.    ALLERGIES: No Known Allergies  VACCINATION STATUS: Immunization History  Administered Date(s) Administered   Influenza-Unspecified 09/03/2018   Moderna Sars-Covid-2 Vaccination 11/30/2019, 12/28/2019    Diabetes She presents for her follow-up diabetic visit. She has type 2 diabetes mellitus. Onset time: She was diagnosed at approximate age of 40 years. Her disease course has been improving (She missed her appointment since October 2024.  She returns only when she will loses control.). There are no hypoglycemic associated symptoms. Pertinent negatives for hypoglycemia include no confusion, headaches, pallor or seizures (.1a). Pertinent negatives for diabetes include no chest pain, no fatigue, no polydipsia, no polyphagia and no polyuria. There are no hypoglycemic complications. Symptoms are improving. There are no diabetic complications. Risk factors for coronary artery disease include diabetes mellitus, dyslipidemia, hypertension and family history. Her weight is fluctuating minimally. She is following a generally unhealthy diet. When asked about meal planning, she reported none. She has not had a previous visit with a dietitian. Her home blood glucose trend is decreasing steadily. Her breakfast blood glucose range is generally 140-180 mg/dl. Her lunch blood glucose range is generally 140-180 mg/dl. Her dinner blood glucose range is generally 140-180 mg/dl. Her bedtime blood glucose range is generally 140-180 mg/dl. Her overall blood glucose range is 140-180 mg/dl. (She presents with her CGM showing significant improvement in her glycemic profile averaging 145 mg.  For the most recent 14 days.  She has 77% in range, 19% level 1 hyperglycemia.  She has no significant hypoglycemia.  Her point-of-care A1c is  6.2% progressively improving.  Coefficient of variation is 32.6%.     ) An ACE inhibitor/angiotensin II receptor blocker is being taken.  Hyperlipidemia This is a chronic problem. The current episode started more than 1 year ago. The problem is uncontrolled. Exacerbating diseases include diabetes. Pertinent negatives include no chest pain, myalgias or shortness of breath. Current antihyperlipidemic treatment includes statins. Risk factors for coronary artery disease include dyslipidemia, diabetes mellitus, hypertension, a sedentary lifestyle and family history.  Hypertension This is a chronic problem. The problem is controlled. Pertinent negatives include no chest pain, headaches, palpitations or shortness of breath. Risk factors for coronary artery disease include dyslipidemia, diabetes mellitus and family history. Past treatments include ACE inhibitors and diuretics.     Objective:       06/22/2024   10:09 AM 02/19/2024   11:08 AM 02/18/2024    2:08 PM  Vitals with BMI  Height 5' 7 5' 7 5' 7  Weight 170 lbs 3 oz 163 lbs 163 lbs 13 oz  BMI 26.65 25.52 25.65  Systolic 118 128 889  Diastolic 76 78 66  Pulse 96 104 104    BP 118/76   Pulse 96   Ht 5' 7 (1.702 m)   Wt 170 lb 3.2 oz (77.2 kg)   LMP 06/02/2016   BMI 26.66 kg/m   Wt Readings from Last 3 Encounters:  06/22/24 170 lb 3.2 oz (77.2 kg)  02/19/24 163 lb (73.9 kg)  02/18/24 163 lb 12.8 oz (74.3 kg)      CMP ( most recent) CMP     Component Value Date/Time   NA 130 (L) 12/26/2023 1750   NA 131 (L) 05/07/2023 1103   K 3.8 12/26/2023 1750   CL 97 (L) 12/26/2023 1750   CO2 23 12/26/2023 1750  GLUCOSE 480 (H) 12/26/2023 1750   BUN 15 12/26/2023 1750   BUN 12 05/07/2023 1103   CREATININE 0.96 12/26/2023 1750   CREATININE 0.69 08/12/2020 1045   CALCIUM  9.2 12/26/2023 1750   PROT 7.3 12/26/2023 1750   PROT 7.3 05/07/2023 1103   ALBUMIN 3.0 (L) 12/26/2023 1750   ALBUMIN 4.1 05/07/2023 1103   AST 11 (L)  12/26/2023 1750   ALT 12 12/26/2023 1750   ALKPHOS 101 12/26/2023 1750   BILITOT 0.8 12/26/2023 1750   BILITOT 0.4 05/07/2023 1103   GFRNONAA >60 12/26/2023 1750   GFRNONAA 99 08/12/2020 1045   GFRAA 114 08/12/2020 1045     Diabetic Labs (most recent): Lab Results  Component Value Date   HGBA1C 6.2 06/22/2024   HGBA1C 8.4 (A) 02/18/2024   HGBA1C 7.3 (A) 06/04/2022   MICROALBUR 30 06/22/2024   MICROALBUR 80 12/14/2020   MICROALBUR 30 08/16/2020   Lipid Panel     Component Value Date/Time   CHOL 271 (H) 05/07/2023 1103   TRIG 163 (H) 05/07/2023 1103   HDL 43 05/07/2023 1103   CHOLHDL 6.3 (H) 05/07/2023 1103   CHOLHDL 5.8 (H) 08/12/2020 1045   LDLCALC 197 (H) 05/07/2023 1103   LDLCALC 173 (H) 08/12/2020 1045   LABVLDL 31 05/07/2023 1103     Assessment & Plan:   1. Type 2 diabetes mellitus with hyperglycemia, with long-term current use of insulin  (HCC)  - Laura Mullins has currently uncontrolled symptomatic type 2 DM since  58 years of age.  She presents with her CGM showing significant improvement in her glycemic profile averaging 145 mg.  For the most recent 14 days.  She has 77% in range, 19% level 1 hyperglycemia.  She has no significant hypoglycemia.  Her point-of-care A1c is 6.2% progressively improving.  Coefficient of variation is 32.6%.     - I had a long discussion with her about the progressive nature of diabetes and the pathology behind its complications. -her diabetes is complicated by peripheral arterial disease, peripheral neuropathy and she remains at exceedingly  high risk for more acute and chronic complications which include CAD, CVA, CKD, retinopathy, and neuropathy. These are all discussed in detail with her.  - I have counseled her on diet  and weight management  by adopting a carbohydrate restricted/protein rich diet. Patient is encouraged to switch to  unprocessed or minimally processed     complex starch and increased protein intake mostly plant  source, fruits, and vegetables.  - she acknowledges that there is a room for improvement in her food and drink choices. - Suggestion is made for her to avoid simple carbohydrates  from her diet including Cakes, Sweet Desserts, Ice Cream, Soda (diet and regular), Sweet Tea, Candies, Chips, Cookies, Store Bought Juices, Alcohol  , Artificial Sweeteners,  Coffee Creamer, and Sugar-free Products, Lemonade. This will help patient to have more stable blood glucose profile and potentially avoid unintended weight gain.  The following Lifestyle Medicine recommendations according to American College of Lifestyle Medicine  [facility]) were discussed and and offered to patient and she  agrees to start the journey:  A. Whole Foods, Plant-Based Nutrition comprising of fruits and vegetables, plant-based proteins, whole-grain carbohydrates was discussed in detail with the patient.   A list for source of those nutrients were also provided to the patient.  Patient will use only water or unsweetened tea for hydration. B.  The need to stay away from risky substances including alcohol , smoking; obtaining 7 to 9  hours of restorative sleep, at least 150 minutes of moderate intensity exercise weekly, the importance of healthy social connections,  and stress management techniques were discussed. C.  A full color page of  Calorie density of various food groups per pound showing examples of each food groups was provided to the patient.  - she will continue follow-up with  Santana Duke, RDN, CDE for diabetes education.  - I have approached her with the following individualized plan to manage  her diabetes and patient agrees:   - In light of her presentation with near target glycemic profile, she will not need prandial insulin  for now.   - She is advised to continue Tresiba  40 units nightly, utilizing her CGM at all times.    - She is advised to continue metformin 500 mg p.o. twice daily with breakfast and supper. - She is  advised to continue glipizide  5 mg XL p.o. daily at breakfast.   - She did not afford the co-pay for Jardiance , she would like to avoid GLP-1 receptor agonist for now.  - Specific targets for  A1c;  LDL, HDL,  and Triglycerides were discussed with the patient.  2) Blood Pressure /Hypertension:  Pressure is controlled to target.  She has mild microalbuminuria. she is advised to continue her current medications including lisinopril /HCTZ 20/12.5 mg p.o. daily.   3) Lipids/Hyperlipidemia: Her recent lipid panel showed uncontrolled LDL at 197.  She is advised to continue with Crestor  10 mg p.o. nightly.  She will have fasting lipid panel before her next visit.  She is at exceedingly high risk for cardiovascular disease.  Side effects and precautions discussed with her.     4)  Weight/Diet:  Body mass index is 26.66 kg/m.  -She is reversing her catabolic phase, presents with 5 pounds of weight gain.  This is a good development for her.  She is not a candidate for weight loss.   This is helping her diabetes and high blood pressure.  she is not  a candidate for any more major weight loss.  Exercise, and detailed carbohydrates information provided  -  detailed on discharge instructions.  5) Chronic Care/Health Maintenance:  -she  Is on ACEI/ARB and Statin medications and  is encouraged to initiate and continue to follow up with Ophthalmology, Dentist,  Podiatrist at least yearly or according to recommendations, and advised to   stay away from smoking. I have recommended yearly flu vaccine and pneumonia vaccine at least every 5 years; moderate intensity exercise for up to 150 minutes weekly; and  sleep for at least 7 hours a day.  Her screening ABI was negative for PAD in December 2021.  This study will be repeated in 2026 or sooner if needed.    - she is  advised to maintain close follow up with Fanta, Tesfaye Demissie, MD for primary care needs, as well as her other providers for optimal and coordinated  care.   I spent  26  minutes in the care of the patient today including review of labs from CMP, Lipids, Thyroid  Function, Hematology (current and previous including abstractions from other facilities); face-to-face time discussing  her blood glucose readings/logs, discussing hypoglycemia and hyperglycemia episodes and symptoms, medications doses, her options of short and long term treatment based on the latest standards of care / guidelines;  discussion about incorporating lifestyle medicine;  and documenting the encounter. Risk reduction counseling performed per USPSTF guidelines to reduce cardiovascular risk factors.     Please refer to Patient  Instructions for Blood Glucose Monitoring and Insulin /Medications Dosing Guide  in media tab for additional information. Please  also refer to  Patient Self Inventory in the Media  tab for reviewed elements of pertinent patient history.  Laura Mullins participated in the discussions, expressed understanding, and voiced agreement with the above plans.  All questions were answered to her satisfaction. she is encouraged to contact clinic should she have any questions or concerns prior to her return visit.   Follow up plan: - Return in about 4 months (around 10/23/2024) for F/U with Pre-visit Labs, Meter/CGM/Logs, A1c here.  Ranny Earl, MD [facility] [address] [place], [place] [address] Phone: [phone]  Fax: [phone]    06/22/2024, 11:13 AM  This note was partially dictated with voice recognition software. Similar sounding words can be transcribed inadequately or may not  be corrected upon review.

## 2024-06-22 NOTE — Patient Instructions (Signed)

## 2024-06-28 ENCOUNTER — Other Ambulatory Visit: Payer: Self-pay | Admitting: Nurse Practitioner

## 2024-06-28 DIAGNOSIS — E1165 Type 2 diabetes mellitus with hyperglycemia: Secondary | ICD-10-CM

## 2024-07-16 ENCOUNTER — Other Ambulatory Visit (HOSPITAL_COMMUNITY): Payer: Self-pay | Admitting: Internal Medicine

## 2024-07-16 DIAGNOSIS — Z1231 Encounter for screening mammogram for malignant neoplasm of breast: Secondary | ICD-10-CM

## 2024-07-21 ENCOUNTER — Encounter (INDEPENDENT_AMBULATORY_CARE_PROVIDER_SITE_OTHER): Payer: Self-pay | Admitting: *Deleted

## 2024-08-16 ENCOUNTER — Other Ambulatory Visit: Payer: Self-pay | Admitting: Nurse Practitioner

## 2024-08-19 ENCOUNTER — Inpatient Hospital Stay (HOSPITAL_COMMUNITY): Admission: RE | Admit: 2024-08-19 | Discharge: 2024-08-19 | Attending: Internal Medicine | Admitting: Internal Medicine

## 2024-08-19 ENCOUNTER — Encounter (HOSPITAL_COMMUNITY): Payer: Self-pay

## 2024-08-19 DIAGNOSIS — Z1231 Encounter for screening mammogram for malignant neoplasm of breast: Secondary | ICD-10-CM | POA: Insufficient documentation

## 2024-09-25 ENCOUNTER — Other Ambulatory Visit (HOSPITAL_COMMUNITY): Payer: Self-pay

## 2024-09-25 ENCOUNTER — Telehealth: Payer: Self-pay | Admitting: Pharmacy Technician

## 2024-09-25 NOTE — Telephone Encounter (Signed)
 Pharmacy Patient Advocate Encounter   Received notification from [facility] KEY that prior authorization for Dexcom G7 Sensor  is required/requested.   Insurance verification completed.   The patient is insured through [facility].   Per test claim: PA required and submitted KEY/EOC/Request #: [id number] from 09/25/24 to 09/25/25. Ran test claim, Copay is $394.07. This test claim was processed through [facility]- copay amounts may vary at other pharmacies due to pharmacy/plan contracts, or as the patient moves through the different stages of their insurance plan.   **Copay is $0.00 when billed with secondary insurance too.**

## 2024-10-23 ENCOUNTER — Ambulatory Visit: Admitting: "Endocrinology
